# Patient Record
Sex: Male | Born: 1940 | Race: White | Hispanic: No | Marital: Married | Smoking: Former smoker
Health system: Southern US, Community
[De-identification: ages and names within clinical notes are randomized; demographics above are authoritative.]

## PROBLEM LIST (undated history)

## (undated) DIAGNOSIS — E119 Type 2 diabetes mellitus without complications: Secondary | ICD-10-CM

## (undated) DIAGNOSIS — F329 Major depressive disorder, single episode, unspecified: Secondary | ICD-10-CM

## (undated) DIAGNOSIS — T8859XA Other complications of anesthesia, initial encounter: Secondary | ICD-10-CM

## (undated) DIAGNOSIS — I10 Essential (primary) hypertension: Secondary | ICD-10-CM

## (undated) DIAGNOSIS — E7800 Pure hypercholesterolemia, unspecified: Secondary | ICD-10-CM

## (undated) DIAGNOSIS — C801 Malignant (primary) neoplasm, unspecified: Secondary | ICD-10-CM

## (undated) DIAGNOSIS — J45909 Unspecified asthma, uncomplicated: Secondary | ICD-10-CM

## (undated) DIAGNOSIS — J449 Chronic obstructive pulmonary disease, unspecified: Secondary | ICD-10-CM

## (undated) DIAGNOSIS — J189 Pneumonia, unspecified organism: Secondary | ICD-10-CM

## (undated) DIAGNOSIS — R319 Hematuria, unspecified: Secondary | ICD-10-CM

## (undated) DIAGNOSIS — T4145XA Adverse effect of unspecified anesthetic, initial encounter: Secondary | ICD-10-CM

## (undated) DIAGNOSIS — F32A Depression, unspecified: Secondary | ICD-10-CM

## (undated) HISTORY — PX: HERNIA REPAIR: SHX51

## (undated) HISTORY — PX: COLON SURGERY: SHX602

## (undated) HISTORY — PX: EYE SURGERY: SHX253

---

## 2007-08-27 ENCOUNTER — Inpatient Hospital Stay: Payer: Self-pay | Admitting: Internal Medicine

## 2007-08-27 ENCOUNTER — Other Ambulatory Visit: Payer: Self-pay

## 2007-09-10 ENCOUNTER — Ambulatory Visit: Payer: Self-pay | Admitting: Specialist

## 2007-09-12 ENCOUNTER — Ambulatory Visit: Payer: Self-pay | Admitting: Specialist

## 2007-09-18 ENCOUNTER — Ambulatory Visit: Payer: Self-pay | Admitting: Specialist

## 2007-09-22 ENCOUNTER — Ambulatory Visit: Payer: Self-pay | Admitting: Internal Medicine

## 2007-10-09 ENCOUNTER — Emergency Department: Payer: Self-pay | Admitting: Emergency Medicine

## 2007-10-20 ENCOUNTER — Ambulatory Visit: Payer: Self-pay | Admitting: Specialist

## 2009-09-19 IMAGING — CR DG CHEST 1V PORT
1 series · 1 of 1 positions shown · non-contrast
Comparison: none

REASON FOR EXAM: dyspnea, hypoxemia
COMMENTS:

PROCEDURE:     DXR - DXR PORTABLE CHEST SINGLE VIEW  - August 27, 2007  [DATE]
RESULT:     Cardiomegaly is noted with mild pulmonary vascular prominence.
No focal infiltrates are noted.

[view not recorded]
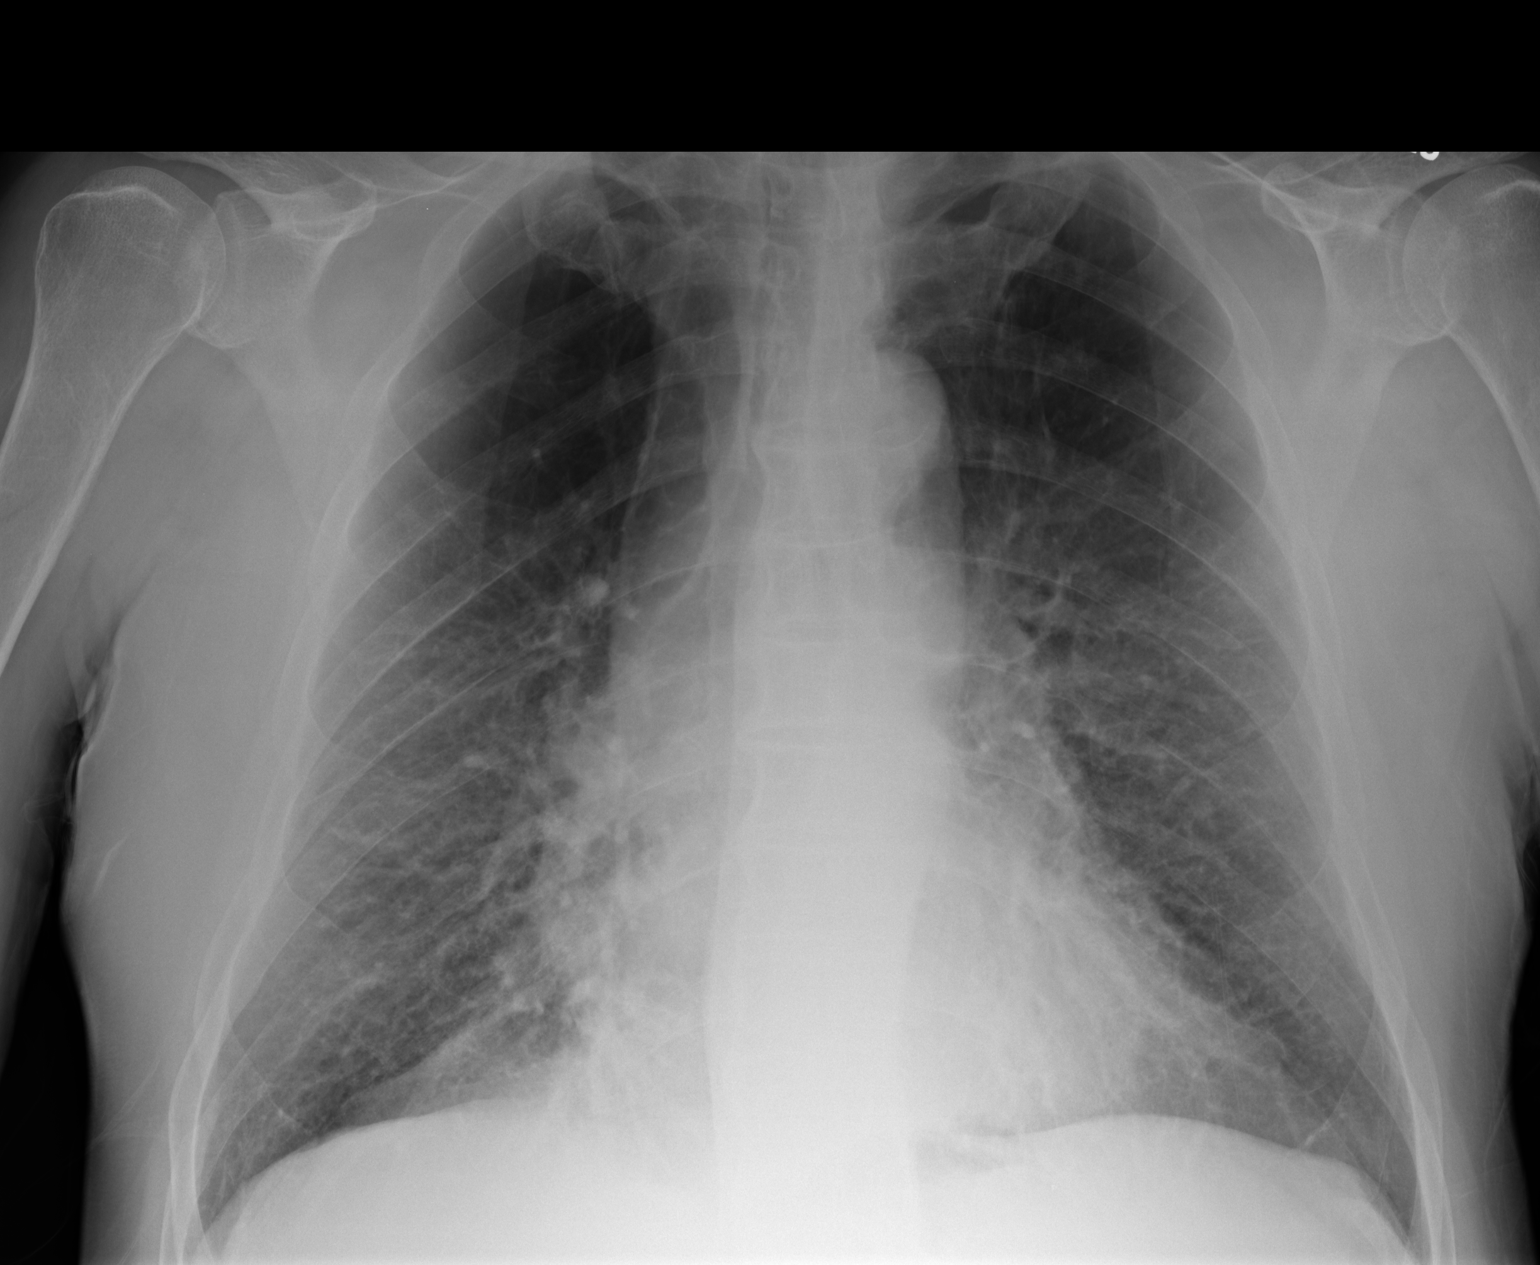

[1 of 1 positions shown; findings below may reference images not displayed]

IMPRESSION: Cardiomegaly with mild pulmonary vascular prominence.

## 2009-09-21 IMAGING — CR DG CHEST 2V
1 series · 2 of 2 positions shown · non-contrast
Comparison: none

REASON FOR EXAM: hypoxia
COMMENTS:

[Series 1: view not recorded · 0.17mm/px · 2 of 2 slices shown]
[im 1/2]
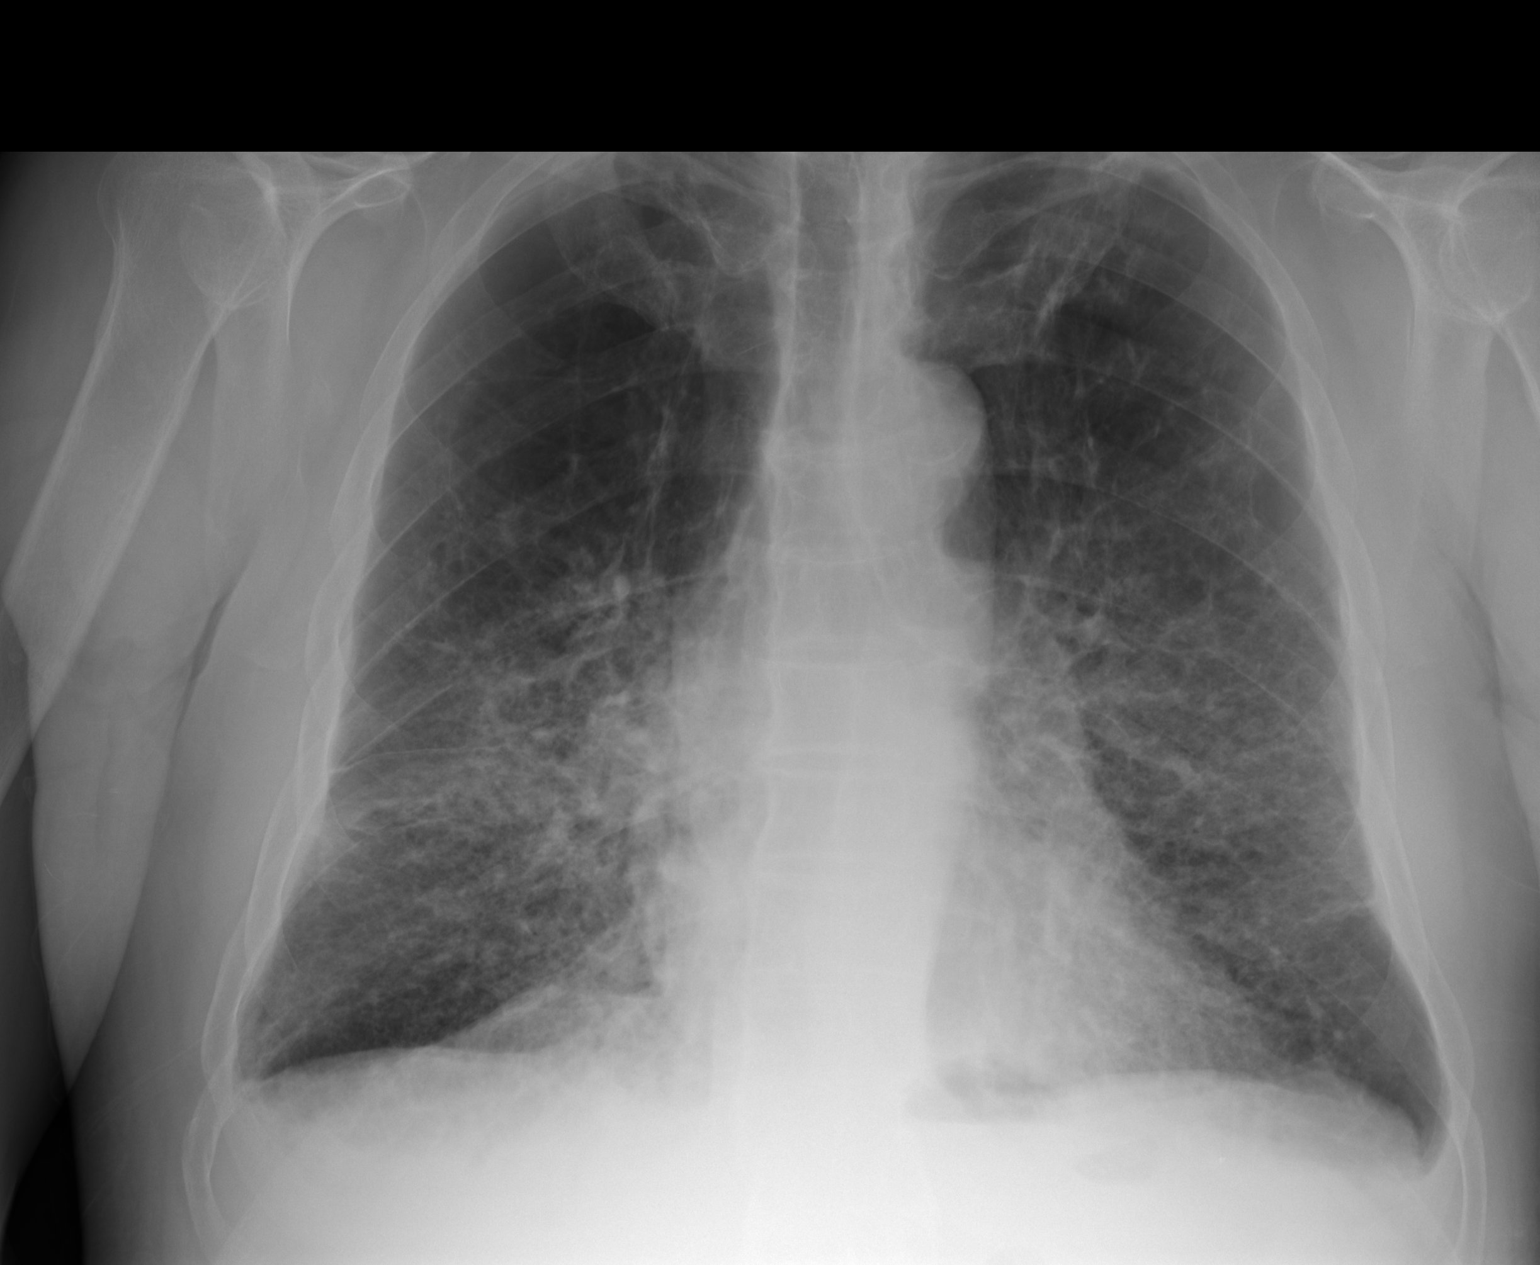
[im 2/2]
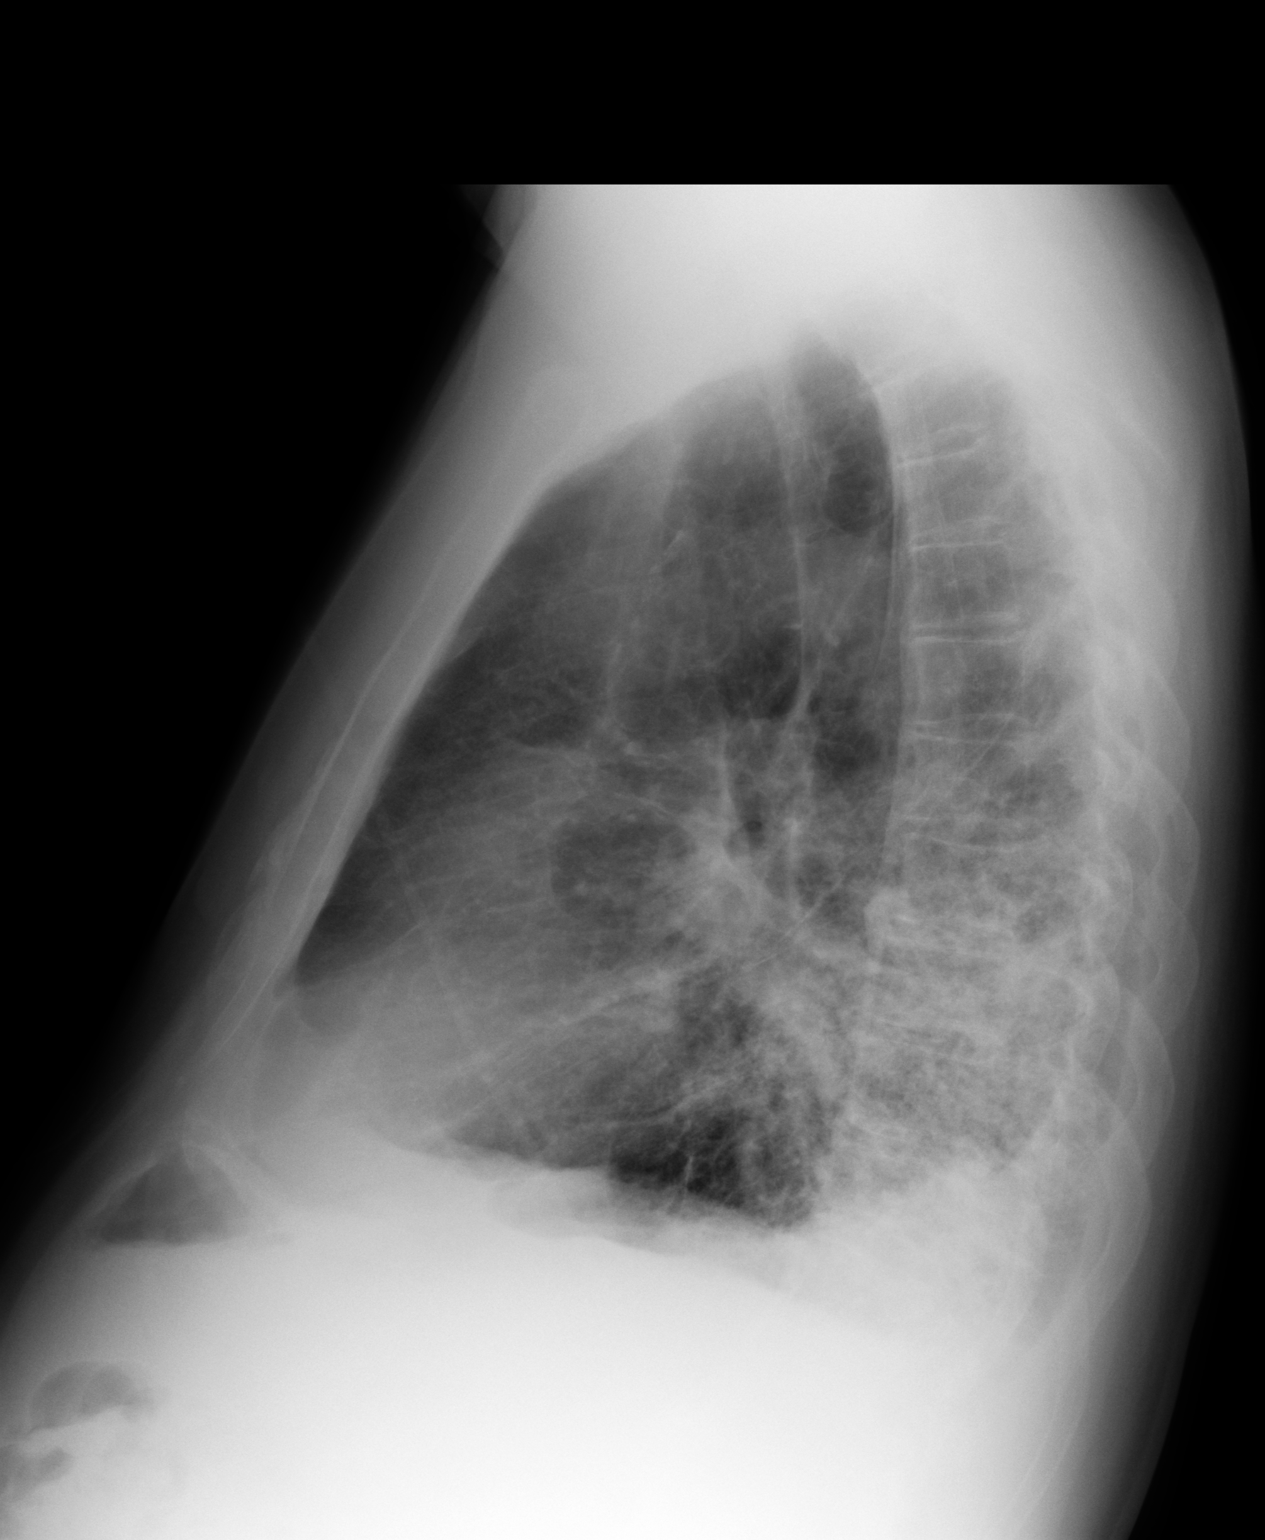

[2 of 2 positions shown; findings below may reference images not displayed]

PROCEDURE:     DXR - DXR CHEST PA (OR AP) AND LATERAL  - August 29, 2007 [DATE]

RESULT:     Comparison is made to a prior exam of 08/27/2007. There is
diffuse increase in density in the RIGHT lower lobe. There is blunting of
the RIGHT posterior gutter compatible with a small RIGHT effusion. The
general appearance suggests interstitial edema superimposed on COPD.
Pneumonia cannot be totally excluded from the differential but at this point
is considered less likely. Continued followup is recommended. Bullous
changes and fibrotic changes are noted in both upper lobes. Overall heart
size is normal.
IMPRESSION: There is increased interstitial and alveolar density in the
RIGHT lower lobe with there being a small associated RIGHT pleural effusion.
Atypical pulmonary edema in a patient with COPD would be the first
consideration; however, pneumonia cannot be excluded from the differential
at this point and continued followup is recommended.

## 2009-09-23 IMAGING — CR DG CHEST 2V
1 series · 2 of 2 positions shown · non-contrast
Comparison: none

REASON FOR EXAM: pna vs chf
COMMENTS:

[Series 1: view not recorded · 0.17mm/px · 2 of 2 slices shown]
[im 1/2]
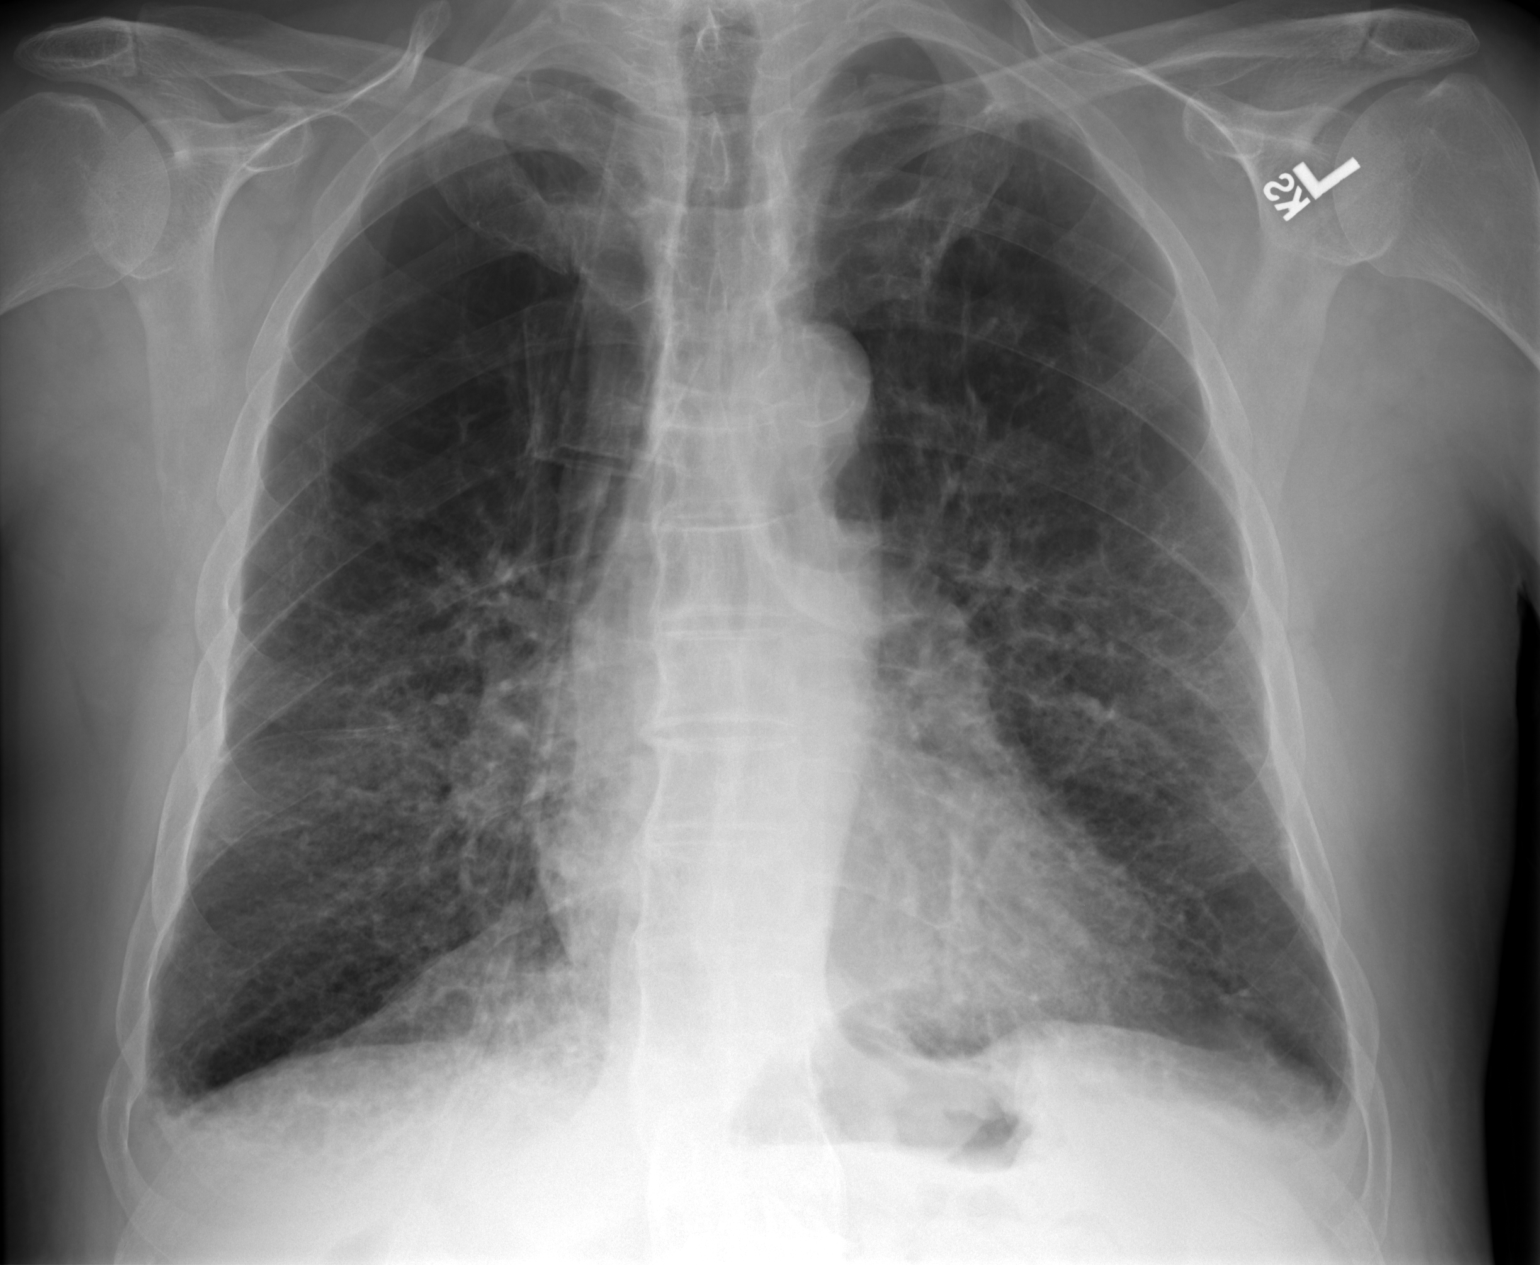
[im 2/2]
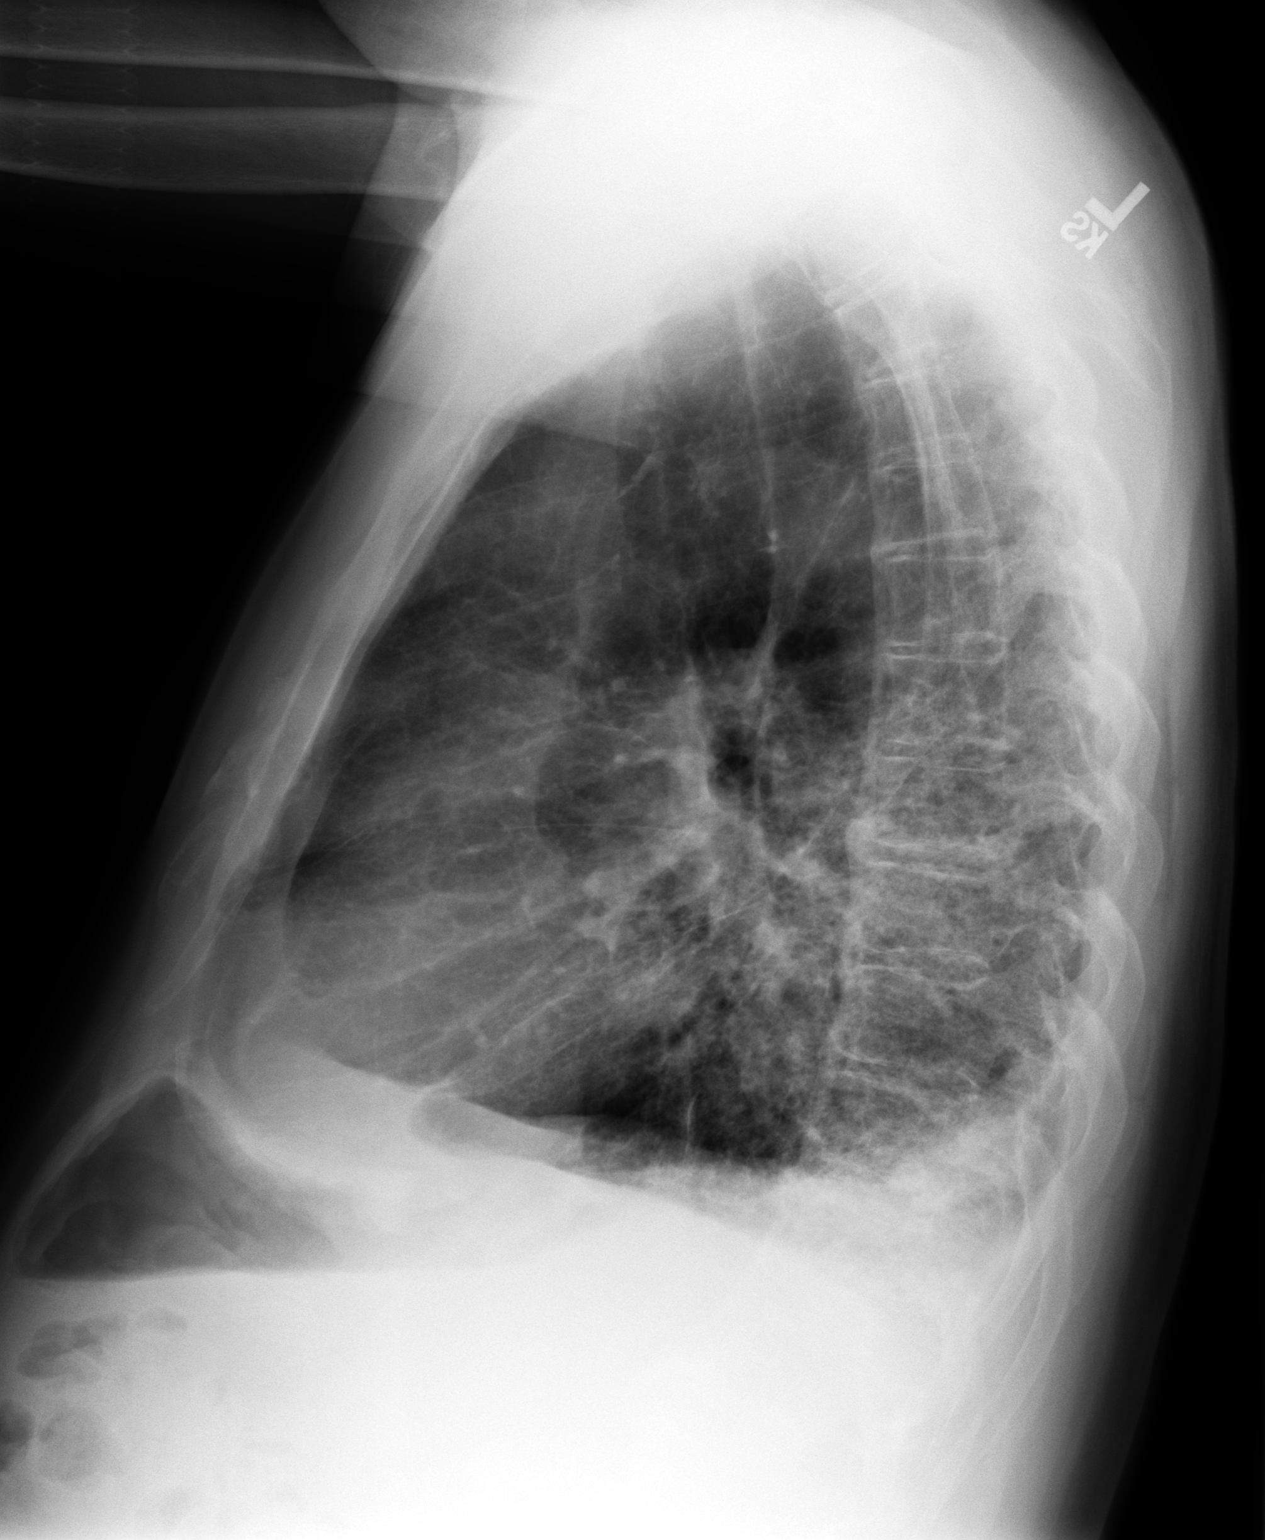

[2 of 2 positions shown; findings below may reference images not displayed]

PROCEDURE:     DXR - DXR CHEST PA (OR AP) AND LATERAL  - August 31, 2007  [DATE]

RESULT:     Comparison is made to study 29 August, 2007.

The interstitial markings remain increased but have improved slightly. The
lungs remain hyperinflated. Minimal blunting of the costophrenic angles
remains visible on the frontal film. On the lateral film there is a small
amount of blunting of the posterior costophrenic gutters.
IMPRESSION: 1.There has been slight interval improvement in the appearance of the
interstitial densities in both lungs. This may reflect resolution of
pulmonary edema of cardiac cause or could reflect some improving infectious
process. Continued follow-up films are recommended.

## 2009-09-24 IMAGING — CT CT CHEST W/O CM
2 series · 16 of 31 positions shown, 19 images · non-contrast
Comparison: none

REASON FOR EXAM: ild
COMMENTS:

[Series 2: soft tissue · axial · 0.80mm/px · z∈[-1114,-884]mm · 7 of 75 slices shown]
[im 10/75  mediastinal]
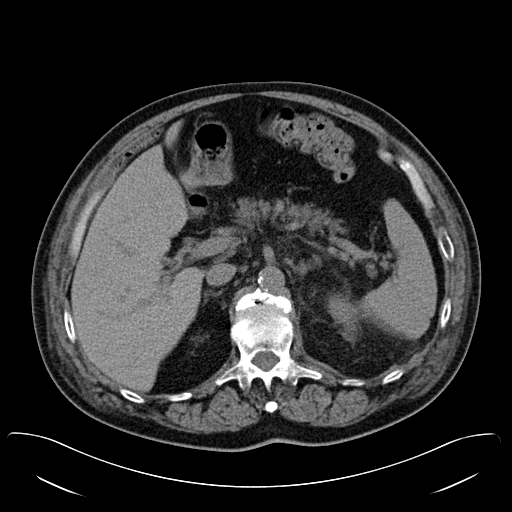
[im 19/75  mediastinal]
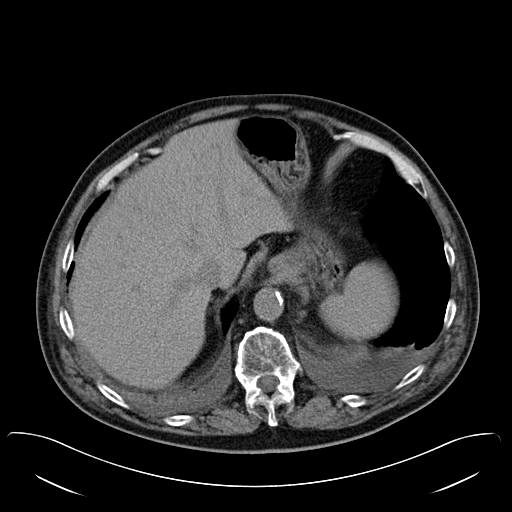
[im 25/75  mediastinal]
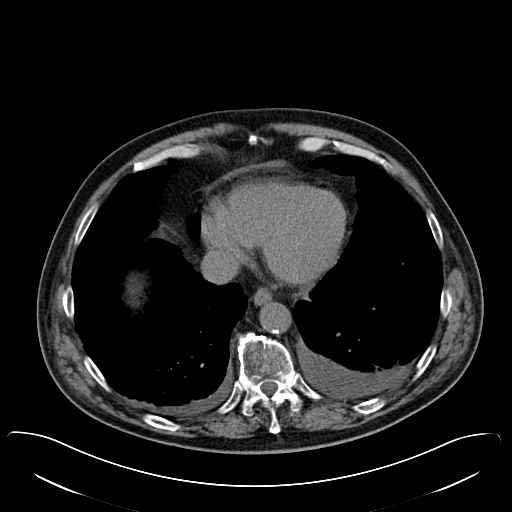
[im 33/75  mediastinal]
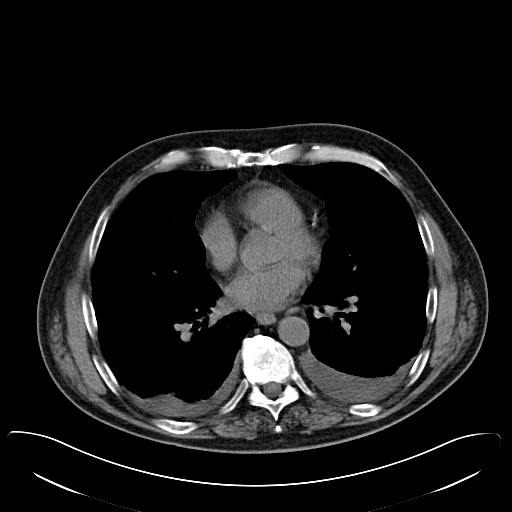
[im 42/75  mediastinal]
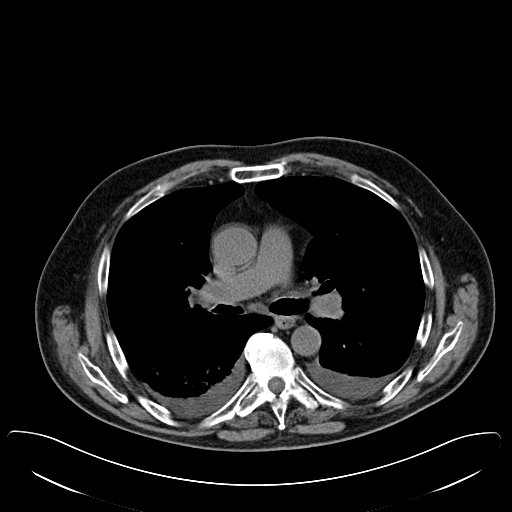
[im 50/75  mediastinal]
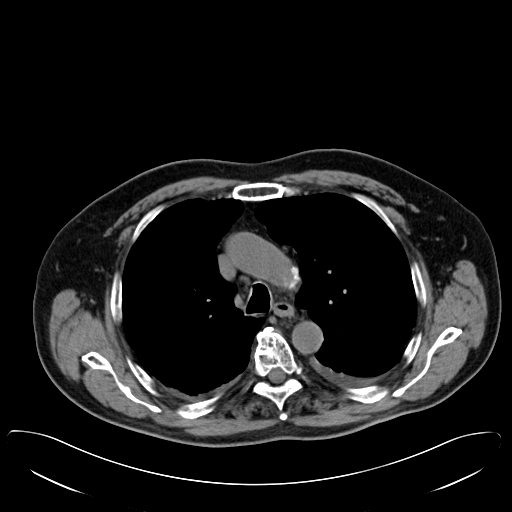
[im 56/75  mediastinal]
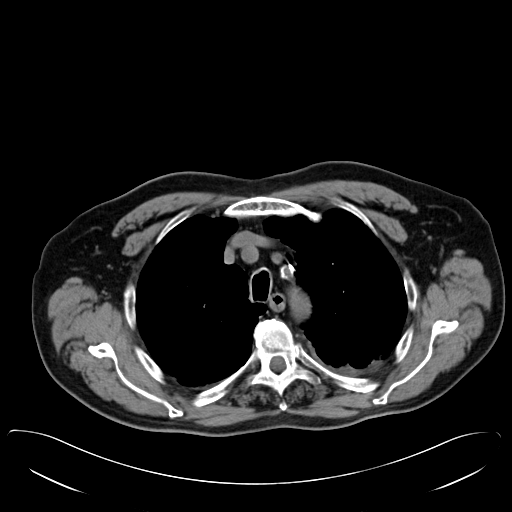

[Series 4: hi res · axial · 0.80mm/px · z∈[-1128,-824]mm · 9 of 48 slices shown, 12 images]
[im 5/48  mediastinal]
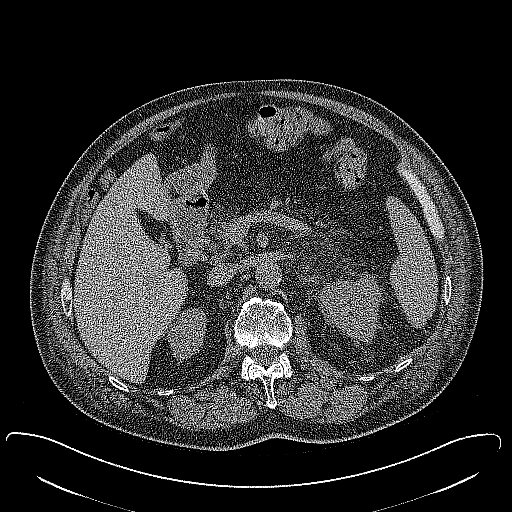
[im 5/48  lung]
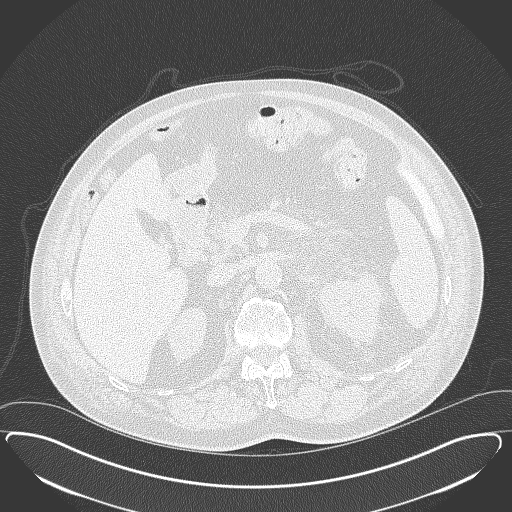
[im 10/48  lung]
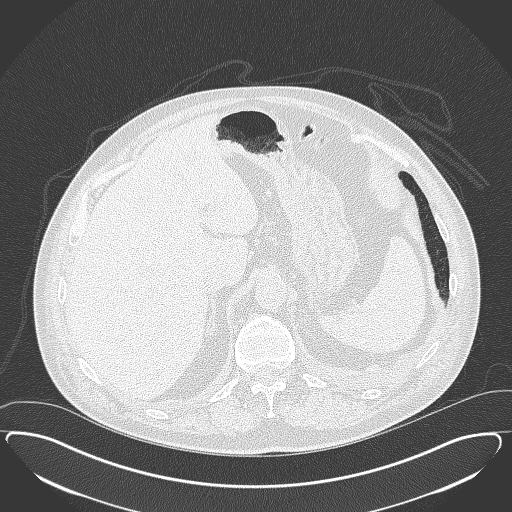
[im 15/48  lung]
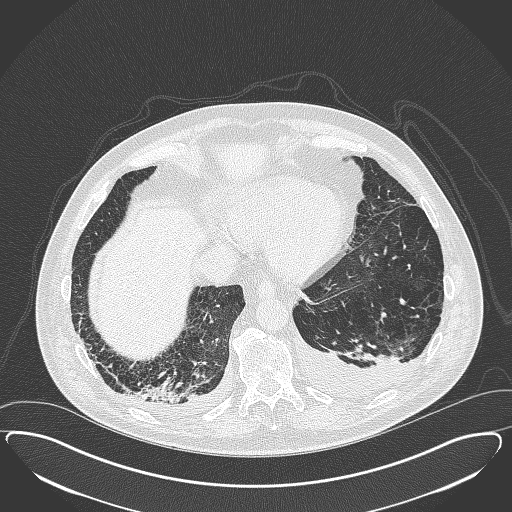
[im 19/48  lung]
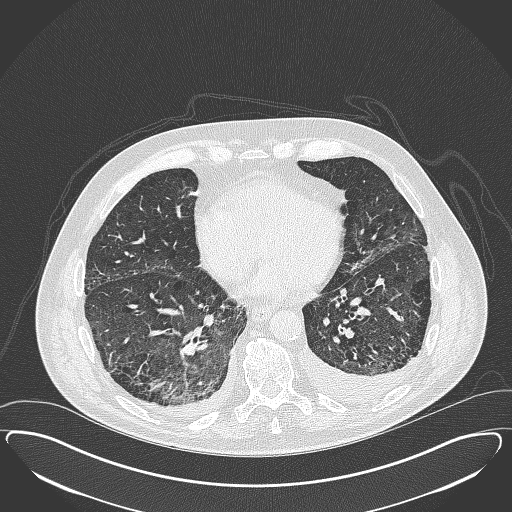
[im 24/48  mediastinal]
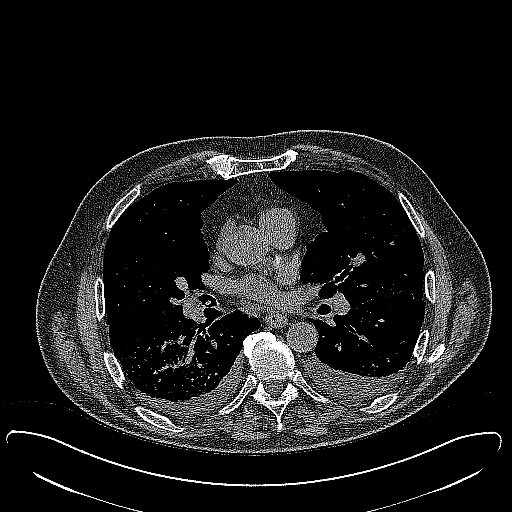
[im 24/48  lung]
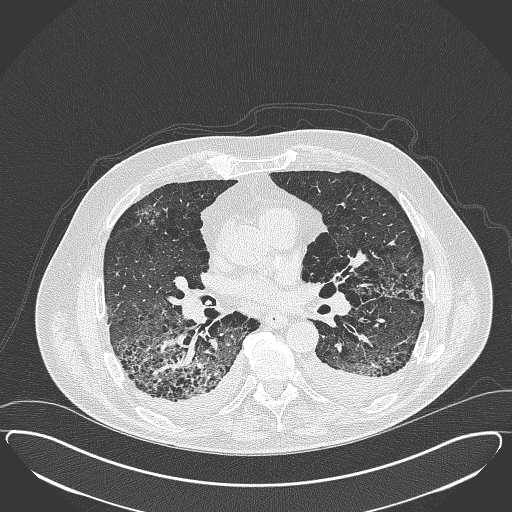
[im 29/48  lung]
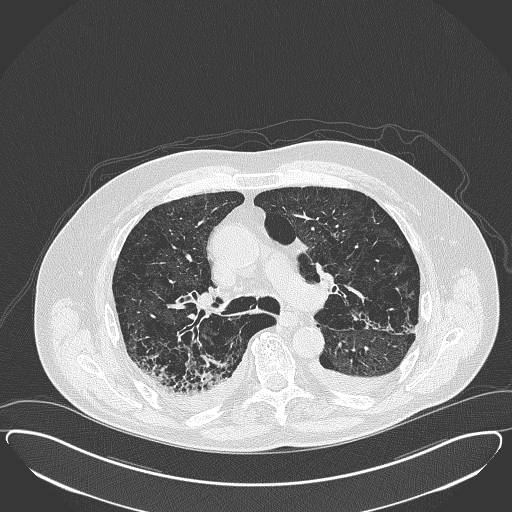
[im 33/48  lung]
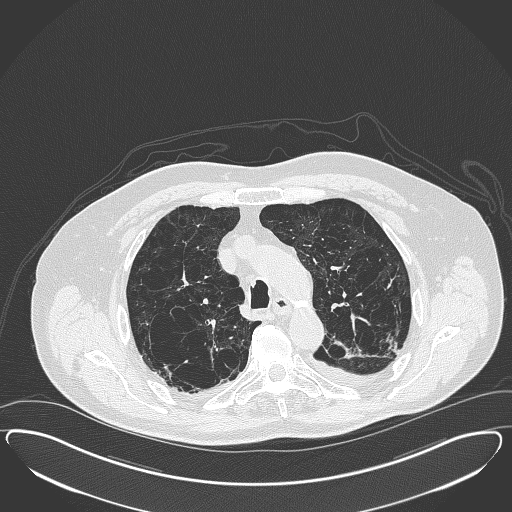
[im 38/48  lung]
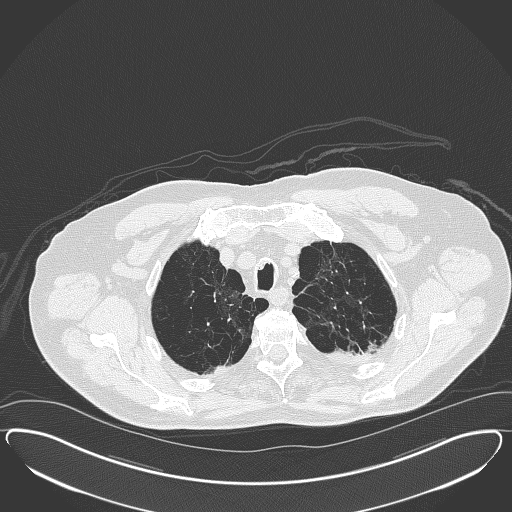
[im 43/48  mediastinal]
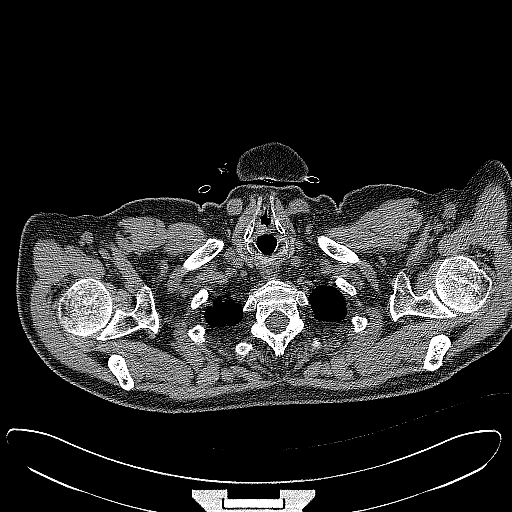
[im 43/48  lung]
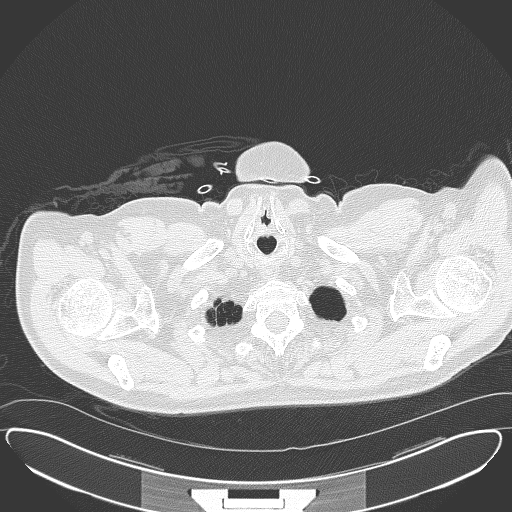

[16 of 31 positions shown; findings below may reference images not displayed]

PROCEDURE:     CT  - CT CHEST WITHOUT CONTRAST  - September 01, 2007  [DATE]

RESULT:     Helical 5-mm sections were obtained from the thoracic inlet
through the lung bases without the administration of intravenous contrast.
The patient has an elevated Creatinine. Also included is high-resolution
imaging of the lung parenchyma.

Evaluation of the mediastinum, hilar regions and structures demonstrates no
evidence of mediastinal or hilar adenopathy or masses.  Small bilateral
effusions are identified.  Evaluation of the lung parenchyma demonstrates
severe emphysematous changes within the lung apices. This is compounded by
diffuse interstitial lung disease with areas of intraparenchymal septal
thickening and subpleural septal thickening and honeycombing within the lung
bases RIGHT greater than LEFT.  No focal regions of consolidation are
appreciated.  Evaluation of the visualized upper abdominal viscera once
again demonstrates hydronephrosis involving the LEFT kidney which was
described on a previous study dated 08/27/07. There appears to be slight
increase in the perinephric stranding as well as the hydronephrosis.  No
further gross abnormalities are identified.
IMPRESSION: 1.     Emphysematous changes and interstitial changes as described above.
No focal regions of consolidation are appreciated.   These findings appear
to be moderate to severe.
2.     Small bilateral pleural effusions.
3.     Persistent hydronephrosis LEFT kidney.

## 2009-10-09 ENCOUNTER — Emergency Department: Payer: Self-pay | Admitting: Emergency Medicine

## 2009-10-10 ENCOUNTER — Emergency Department: Payer: Self-pay | Admitting: Emergency Medicine

## 2009-10-24 ENCOUNTER — Ambulatory Visit: Payer: Self-pay | Admitting: Urology

## 2012-03-24 ENCOUNTER — Ambulatory Visit: Payer: Self-pay | Admitting: Internal Medicine

## 2012-04-09 ENCOUNTER — Other Ambulatory Visit: Payer: Self-pay | Admitting: Urology

## 2012-04-09 NOTE — Progress Notes (Signed)
Surgery scheduled for 04/21/12.  Preop appointment 04/15/12 at 1000.  Need orders in EPIC.  Thanks.

## 2012-04-10 ENCOUNTER — Encounter (HOSPITAL_COMMUNITY): Payer: Self-pay | Admitting: Pharmacy Technician

## 2012-04-14 NOTE — Patient Instructions (Addendum)
20 Alejandro Schmidt  04/14/2012   Your procedure is scheduled on:  04/21/12  MONDAY  Report to [facility] at   0700    AM.  Call this number if you have problems the morning of surgery: [phone]     BRING INHALERS WITH YOU TO HOSPITAL  Remember: DO NOT TAKE ANY BLOOD SUGAR MEDICATION MORNING OF SURGERY  Do not eat food  Or drink :After Midnight. Sunday NIGHT   Take these medicines the morning of surgery with A SIP OF WATER:  Amlodipine, Zoloft     Symbicort                                              May use Combivent if needed   .  Contacts, dentures or partial plates can not be worn to surgery  Leave suitcase in the car. After surgery it may be brought to your room.  For patients admitted to the hospital, checkout time is 11:00 AM day of  discharge.             SPECIAL INSTRUCTIONS- SEE [facility] PREPARING FOR SURGERY INSTRUCTION SHEET-     DO NOT WEAR JEWELRY, LOTIONS, POWDERS, OR PERFUMES.  WOMEN-- DO NOT SHAVE LEGS OR UNDERARMS FOR 12 HOURS BEFORE SHOWERS. MEN MAY SHAVE FACE.  Patients discharged the day of surgery will not be allowed to drive home. IF going home the day of surgery, you must have a driver and someone to stay with you for the first 24 hours  Name and phone number of your driver:  Fonnie Birkenhead                                                                      Please read over the following fact sheets that you were given: MRSA Information, Incentive Spirometry Sheet, Blood Transfusion Sheet  Information                                                                                   [name]  PST [phone]  [phone]                 FAILURE TO FOLLOW THESE INSTRUCTIONS MAY RESULT IN  CANCELLATION   OF YOUR SURGERY                                                  Patient Signature _____________________________

## 2012-04-15 ENCOUNTER — Encounter (HOSPITAL_COMMUNITY): Payer: Self-pay

## 2012-04-15 ENCOUNTER — Ambulatory Visit (HOSPITAL_COMMUNITY)
Admission: RE | Admit: 2012-04-15 | Discharge: 2012-04-15 | Disposition: A | Discharge status: Home / Self Care | Payer: Medicare Other | Source: Ambulatory Visit | Attending: Urology | Admitting: Urology

## 2012-04-15 ENCOUNTER — Encounter (HOSPITAL_COMMUNITY)
Admission: RE | Admit: 2012-04-15 | Discharge: 2012-04-15 | Disposition: A | Discharge status: Home / Self Care | Payer: Medicare Other | Source: Ambulatory Visit | Attending: Urology | Admitting: Urology

## 2012-04-15 DIAGNOSIS — J449 Chronic obstructive pulmonary disease, unspecified: Secondary | ICD-10-CM | POA: Insufficient documentation

## 2012-04-15 DIAGNOSIS — N478 Other disorders of prepuce: Secondary | ICD-10-CM | POA: Insufficient documentation

## 2012-04-15 DIAGNOSIS — Z01812 Encounter for preprocedural laboratory examination: Secondary | ICD-10-CM | POA: Insufficient documentation

## 2012-04-15 DIAGNOSIS — N210 Calculus in bladder: Secondary | ICD-10-CM | POA: Insufficient documentation

## 2012-04-15 DIAGNOSIS — J4489 Other specified chronic obstructive pulmonary disease: Secondary | ICD-10-CM | POA: Insufficient documentation

## 2012-04-15 DIAGNOSIS — I10 Essential (primary) hypertension: Secondary | ICD-10-CM | POA: Insufficient documentation

## 2012-04-15 DIAGNOSIS — N471 Phimosis: Secondary | ICD-10-CM | POA: Insufficient documentation

## 2012-04-15 HISTORY — DX: Malignant (primary) neoplasm, unspecified: C80.1

## 2012-04-15 HISTORY — DX: Pure hypercholesterolemia, unspecified: E78.00

## 2012-04-15 HISTORY — DX: Essential (primary) hypertension: I10

## 2012-04-15 HISTORY — DX: Depression, unspecified: F32.A

## 2012-04-15 HISTORY — DX: Hematuria, unspecified: R31.9

## 2012-04-15 HISTORY — DX: Unspecified asthma, uncomplicated: J45.909

## 2012-04-15 HISTORY — DX: Chronic obstructive pulmonary disease, unspecified: J44.9

## 2012-04-15 HISTORY — DX: Major depressive disorder, single episode, unspecified: F32.9

## 2012-04-15 HISTORY — DX: Type 2 diabetes mellitus without complications: E11.9

## 2012-04-15 HISTORY — DX: Pneumonia, unspecified organism: J18.9

## 2012-04-15 LAB — BASIC METABOLIC PANEL
BUN: 14 mg/dL (ref 6–23)
CO2: 27 mEq/L (ref 19–32)
Calcium: 9.6 mg/dL (ref 8.4–10.5)
Chloride: 107 mEq/L (ref 96–112)
Creatinine, Ser: 0.88 mg/dL (ref 0.50–1.35)
GFR calc Af Amer: >90 mL/min (ref >90)
GFR calc non Af Amer: 84 mL/min — ABNORMAL LOW (ref >90)
Glucose, Bld: 157 mg/dL — ABNORMAL HIGH (ref 70–99)
Potassium: 5.2 mEq/L — ABNORMAL HIGH (ref 3.5–5.1)
Sodium: 141 mEq/L (ref 135–145)

## 2012-04-15 LAB — CBC
HCT: 41.7 % (ref 39.0–52.0)
Hemoglobin: 13.6 g/dL (ref 13.0–17.0)
MCH: 28.1 pg (ref 26.0–34.0)
MCHC: 32.6 g/dL (ref 30.0–36.0)
MCV: 86.2 fL (ref 78.0–100.0)
Platelets: 243 10*3/uL (ref 150–400)
RBC: 4.84 MIL/uL (ref 4.22–5.81)
RDW: 13.9 % (ref 11.5–15.5)
WBC: 10.2 10*3/uL (ref 4.0–10.5)

## 2012-04-15 LAB — SURGICAL PCR SCREEN
MRSA, PCR: NEGATIVE
Staphylococcus aureus: NEGATIVE

## 2012-04-15 NOTE — Progress Notes (Signed)
04/15/12 1005  OBSTRUCTIVE SLEEP APNEA  Have you ever been diagnosed with sleep apnea through a sleep study? No  Do you snore loudly (loud enough to be heard through closed doors)?  0  Do you often feel tired, fatigued, or sleepy during the daytime? 1  Has anyone observed you stop breathing during your sleep? 0  Do you have, or are you being treated for high blood pressure? 1  BMI more than 35 kg/m2? 0  Age over 73 years old? 1  Neck circumference greater than 40 cm/18 inches? 0  Gender: 1  Obstructive Sleep Apnea Score 4  Score 4 or greater  Results sent to PCP

## 2012-04-15 NOTE — Progress Notes (Signed)
EKG 3/14 on chart

## 2012-04-19 DIAGNOSIS — N210 Calculus in bladder: Secondary | ICD-10-CM | POA: Diagnosis present

## 2012-04-19 DIAGNOSIS — N471 Phimosis: Secondary | ICD-10-CM | POA: Diagnosis present

## 2012-04-19 NOTE — H&P (Signed)
History of Present Illness        Bladder calculus: A renal ultrasound done on 03/24/12 revealed no evidence of any renal calculi however a ~2 cm bladder stone was noted.  Left parapelvic cyst: This is noted on renal ultrasound in 3/14. No other abnormality of the kidneys was noted.   He reports to me that for about 2 or 3 weeks she's had fairly significant urgency with associated frequency and some mild dysuria. He's had nocturia as well as intermittency and slowing of his urinary stream. He really didn't have much in the way of voiding symptoms prior to this occurring. He has experienced gross hematuria. He has not any fever or chills. His symptoms are of moderate severity with with no modifying factors. He also reports that he has been unable to retract his foreskin. When he does attempt this causes bleeding. He has not had difficulty with infections beneath the foreskin.   Past Medical History Problems  1. History of  Anxiety (Symptom) 300.00 2. History of  Asthma 493.90 3. History of  Colon Cancer V10.05 4. History of  Depression 311 5. History of  Diabetes Mellitus 250.00 6. History of  Hypercholesterolemia 272.0 7. History of  Hypertension 401.9 8. History of  Nephrolithiasis V13.01  Surgical History Problems  1. History of  Colon Surgery 2. History of  Hernia Repair Bilateral  Current Meds 1. AmLODIPine Besylate 10 MG Oral Tablet; Therapy: (Recorded:08Apr2014) to 2. Losartan Potassium 50 MG Oral Tablet; Therapy: (Recorded:08Apr2014) to 3. MetFORMIN HCl 1000 MG Oral Tablet; Therapy: (Recorded:08Apr2014) to 4. Zoloft 50 MG Oral Tablet; Therapy: (Recorded:08Apr2014) to  Allergies Medication  1. No Known Drug Allergies  Family History Problems  1. Family history of  No Significant Family History  Social History Problems    Caffeine Use   Former Smoker V15.82   Marital History - Currently Married   Retired From Work Denied    History of  Alcohol Use  Review of  Systems Genitourinary, constitutional, skin, eye, otolaryngeal, hematologic/lymphatic, cardiovascular, pulmonary, endocrine, musculoskeletal, gastrointestinal, neurological and psychiatric system(s) were reviewed and pertinent findings if present are noted.  Genitourinary: urinary frequency, feelings of urinary urgency, dysuria, nocturia, incontinence, weak urinary stream, urinary stream starts and stops, hematuria and penile pain.  ENT: sinus problems.  Hematologic/Lymphatic: a tendency to easily bruise.  Respiratory: shortness of breath and cough.  Psychiatric: anxiety and depression.    Vitals Vital Signs BMI Calculated: 24.2 BSA Calculated: 1.74 Height: 5 ft 5.5 in Weight: 147 lb  Blood Pressure: 137 / 74 Temperature: 99.3 F Heart Rate: 85  Physical Exam Constitutional: Well nourished and well developed . No acute distress.  ENT:. The ears and nose are normal in appearance.  Neck: The appearance of the neck is normal and no neck mass is present.  Pulmonary: No respiratory distress and normal respiratory rhythm and effort.  Cardiovascular: Heart rate and rhythm are normal . No peripheral edema.  Abdomen: The abdomen is soft and nontender. No masses are palpated. No CVA tenderness. No hernias are palpable. No hepatosplenomegaly noted.  Rectal: Rectal exam demonstrates normal sphincter tone, no tenderness and no masses. He has a mild rectal stricture. The prostate has no nodularity and is not tender. The left seminal vesicle is nonpalpable. The right seminal vesicle is nonpalpable. The perineum is normal on inspection.  Genitourinary: Examination of the penis demonstrates phimosis, but no discharge, no masses, no lesions and a normal meatus. The penis is uncircumcised. The scrotum is without lesions. The  right epididymis is palpably normal and non-tender. The left epididymis is palpably normal and non-tender. The right testis is non-tender and without masses. The left testis is non-tender  and without masses.  Lymphatics: The femoral and inguinal nodes are not enlarged or tender.  Skin: Normal skin turgor, no visible rash and no visible skin lesions.  Neuro/Psych:. Mood and affect are appropriate.    Results/Data Urine  COLOR YELLOW   APPEARANCE CLEAR   SPECIFIC GRAVITY 1.020   pH 5.5   GLUCOSE NEG mg/dL  BILIRUBIN NEG   KETONE NEG mg/dL  BLOOD MOD   PROTEIN 30 mg/dL  UROBILINOGEN 0.2 mg/dL  NITRITE NEG   LEUKOCYTE ESTERASE SMALL   SQUAMOUS EPITHELIAL/HPF RARE   WBC 0-2 WBC/hpf  RBC 3-6 RBC/hpf  BACTERIA RARE   CRYSTALS NONE SEEN   CASTS NONE SEEN    Old records or history reviewed: Notes from Dr. Tilman Neat office as above.  The following images/tracing/specimen were independently visualized:  KUB: I see what appeared to be at least 2 if not possibly 3 large stones within his bladder. They appear fairly radio opaque so I hope is that he will not be particularly dense.  The following clinical lab reports were reviewed:  UA: His urine had no sign of infection.  The following radiology reports were reviewed: Ultrasound as above.    Assessment Assessed  1. Bladder Calculus 594.1 2. Peripelvic Cyst In The Left Kidney 593.2 3. Phimosis 605 4. Benign Prostatic Hypertrophy With Urinary Obstruction 600.01   We discussed the need to address his bladder calculi. I went over the procedure cystolitholapaxy with him including how the procedure is performed. The risks and complications and alternatives. I don't think this needs to be performed open. We did discuss the fact that bladder calculi typically form in patient's that do not empty the bladder completely and I told him that I would evaluate his outlet at the time of the procedure and if it appeared to be obstructing I would consider proceeding with a transurethral incision of the prostate in order to reduce his risk of recurrent bladder calculi formation. He was in agreement with that. He asked if I could also perform a  circumcision. He has a significant phimosis. I told him that that could be performed simultaneously. I went over this procedure as well. We discussed its risks and complications, the probability of success of both of the procedures as well as the anticipated postoperative course. He understands and has elected to proceed with both.   Plan    He will be scheduled for outpatient cystolitholapaxy, possible transurethral incision of his prostate and circumcision.

## 2012-04-21 ENCOUNTER — Encounter (HOSPITAL_COMMUNITY): Payer: Self-pay | Admitting: Certified Registered Nurse Anesthetist

## 2012-04-21 ENCOUNTER — Ambulatory Visit (HOSPITAL_COMMUNITY): Payer: Medicare Other | Admitting: Certified Registered Nurse Anesthetist

## 2012-04-21 ENCOUNTER — Encounter (HOSPITAL_COMMUNITY)
Admission: RE | Disposition: A | Discharge status: Home / Self Care | Payer: Self-pay | Source: Ambulatory Visit | Attending: Urology

## 2012-04-21 ENCOUNTER — Ambulatory Visit (HOSPITAL_COMMUNITY)
Admission: RE | Admit: 2012-04-21 | Discharge: 2012-04-21 | Disposition: A | Discharge status: Home / Self Care | Payer: Medicare Other | Source: Ambulatory Visit | Attending: Urology | Admitting: Urology

## 2012-04-21 ENCOUNTER — Encounter (HOSPITAL_COMMUNITY): Payer: Self-pay | Admitting: *Deleted

## 2012-04-21 DIAGNOSIS — F329 Major depressive disorder, single episode, unspecified: Secondary | ICD-10-CM | POA: Insufficient documentation

## 2012-04-21 DIAGNOSIS — Z85038 Personal history of other malignant neoplasm of large intestine: Secondary | ICD-10-CM | POA: Insufficient documentation

## 2012-04-21 DIAGNOSIS — Q619 Cystic kidney disease, unspecified: Secondary | ICD-10-CM | POA: Insufficient documentation

## 2012-04-21 DIAGNOSIS — F3289 Other specified depressive episodes: Secondary | ICD-10-CM | POA: Insufficient documentation

## 2012-04-21 DIAGNOSIS — N471 Phimosis: Secondary | ICD-10-CM | POA: Diagnosis present

## 2012-04-21 DIAGNOSIS — N401 Enlarged prostate with lower urinary tract symptoms: Secondary | ICD-10-CM | POA: Insufficient documentation

## 2012-04-21 DIAGNOSIS — E7800 Pure hypercholesterolemia, unspecified: Secondary | ICD-10-CM | POA: Insufficient documentation

## 2012-04-21 DIAGNOSIS — R32 Unspecified urinary incontinence: Secondary | ICD-10-CM | POA: Insufficient documentation

## 2012-04-21 DIAGNOSIS — N210 Calculus in bladder: Secondary | ICD-10-CM

## 2012-04-21 DIAGNOSIS — R350 Frequency of micturition: Secondary | ICD-10-CM | POA: Insufficient documentation

## 2012-04-21 DIAGNOSIS — J4489 Other specified chronic obstructive pulmonary disease: Secondary | ICD-10-CM | POA: Insufficient documentation

## 2012-04-21 DIAGNOSIS — E119 Type 2 diabetes mellitus without complications: Secondary | ICD-10-CM | POA: Insufficient documentation

## 2012-04-21 DIAGNOSIS — N478 Other disorders of prepuce: Secondary | ICD-10-CM | POA: Insufficient documentation

## 2012-04-21 DIAGNOSIS — I10 Essential (primary) hypertension: Secondary | ICD-10-CM | POA: Insufficient documentation

## 2012-04-21 DIAGNOSIS — Z87891 Personal history of nicotine dependence: Secondary | ICD-10-CM | POA: Insufficient documentation

## 2012-04-21 DIAGNOSIS — R351 Nocturia: Secondary | ICD-10-CM | POA: Insufficient documentation

## 2012-04-21 DIAGNOSIS — R3915 Urgency of urination: Secondary | ICD-10-CM | POA: Insufficient documentation

## 2012-04-21 DIAGNOSIS — F411 Generalized anxiety disorder: Secondary | ICD-10-CM | POA: Insufficient documentation

## 2012-04-21 DIAGNOSIS — R39198 Other difficulties with micturition: Secondary | ICD-10-CM | POA: Insufficient documentation

## 2012-04-21 DIAGNOSIS — Z79899 Other long term (current) drug therapy: Secondary | ICD-10-CM | POA: Insufficient documentation

## 2012-04-21 DIAGNOSIS — N138 Other obstructive and reflux uropathy: Secondary | ICD-10-CM | POA: Insufficient documentation

## 2012-04-21 DIAGNOSIS — J449 Chronic obstructive pulmonary disease, unspecified: Secondary | ICD-10-CM | POA: Insufficient documentation

## 2012-04-21 DIAGNOSIS — Z87442 Personal history of urinary calculi: Secondary | ICD-10-CM | POA: Insufficient documentation

## 2012-04-21 HISTORY — DX: Adverse effect of unspecified anesthetic, initial encounter: T41.45XA

## 2012-04-21 HISTORY — PX: HOLMIUM LASER APPLICATION: SHX5852

## 2012-04-21 HISTORY — PX: TRANSURETHRAL INCISION OF PROSTATE: SHX2573

## 2012-04-21 HISTORY — PX: CYSTOSCOPY WITH LITHOLAPAXY: SHX1425

## 2012-04-21 HISTORY — DX: Other complications of anesthesia, initial encounter: T88.59XA

## 2012-04-21 HISTORY — PX: CIRCUMCISION: SHX1350

## 2012-04-21 LAB — GLUCOSE, CAPILLARY: Glucose-Capillary: 176 mg/dL — ABNORMAL HIGH (ref 70–99)

## 2012-04-21 SURGERY — CYSTOSCOPY, WITH BLADDER CALCULUS LITHOLAPAXY
Anesthesia: General | Wound class: Clean Contaminated

## 2012-04-21 MED ORDER — ALBUTEROL SULFATE HFA 108 (90 BASE) MCG/ACT IN AERS
INHALATION_SPRAY | RESPIRATORY_TRACT | Status: AC
  Filled 2012-04-21: qty 6.7

## 2012-04-21 MED ORDER — LACTATED RINGERS IV SOLN
INTRAVENOUS | Status: DC | PRN
  Administered 2012-04-21 (×2): via INTRAVENOUS

## 2012-04-21 MED ORDER — PHENAZOPYRIDINE HCL 200 MG PO TABS
200.0000 mg | ORAL_TABLET | Freq: Once | ORAL | Status: AC
  Administered 2012-04-21: 200 mg via ORAL
  Filled 2012-04-21: qty 1

## 2012-04-21 MED ORDER — LIDOCAINE HCL 2 % EX GEL
CUTANEOUS | Status: DC | PRN
  Administered 2012-04-21: 1 via URETHRAL

## 2012-04-21 MED ORDER — BACITRACIN ZINC 500 UNIT/GM EX OINT
TOPICAL_OINTMENT | CUTANEOUS | Status: AC
  Filled 2012-04-21: qty 15

## 2012-04-21 MED ORDER — ALBUTEROL SULFATE HFA 108 (90 BASE) MCG/ACT IN AERS
INHALATION_SPRAY | RESPIRATORY_TRACT | Status: DC | PRN
  Administered 2012-04-21: 5 via RESPIRATORY_TRACT

## 2012-04-21 MED ORDER — ONDANSETRON HCL 4 MG/2ML IJ SOLN
INTRAMUSCULAR | Status: DC | PRN
  Administered 2012-04-21: 4 mg via INTRAVENOUS

## 2012-04-21 MED ORDER — HYDROCODONE-ACETAMINOPHEN 10-325 MG PO TABS: 1.0000 | ORAL_TABLET | Freq: Four times a day (QID) | ORAL | Status: AC | PRN

## 2012-04-21 MED ORDER — CEFAZOLIN SODIUM-DEXTROSE 2-3 GM-% IV SOLR
2.0000 g | INTRAVENOUS | Status: AC
  Administered 2012-04-21: 2 g via INTRAVENOUS

## 2012-04-21 MED ORDER — FENTANYL CITRATE 0.05 MG/ML IJ SOLN
INTRAMUSCULAR | Status: DC | PRN
  Administered 2012-04-21: 50 ug via INTRAVENOUS
  Administered 2012-04-21: 25 ug via INTRAVENOUS
  Administered 2012-04-21: 50 ug via INTRAVENOUS
  Administered 2012-04-21 (×2): 25 ug via INTRAVENOUS
  Administered 2012-04-21 (×2): 50 ug via INTRAVENOUS
  Administered 2012-04-21: 25 ug via INTRAVENOUS

## 2012-04-21 MED ORDER — BUPIVACAINE HCL 0.5 % IJ SOLN
INTRAMUSCULAR | Status: AC
  Filled 2012-04-21: qty 1

## 2012-04-21 MED ORDER — OXYCODONE HCL 5 MG/5ML PO SOLN
5.0000 mg | Freq: Once | ORAL | Status: DC | PRN
  Filled 2012-04-21: qty 5

## 2012-04-21 MED ORDER — ACETAMINOPHEN 10 MG/ML IV SOLN
INTRAVENOUS | Status: AC
  Filled 2012-04-21: qty 100

## 2012-04-21 MED ORDER — ACETAMINOPHEN 10 MG/ML IV SOLN
INTRAVENOUS | Status: DC | PRN
  Administered 2012-04-21: 1000 mg via INTRAVENOUS

## 2012-04-21 MED ORDER — OXYCODONE HCL 5 MG PO TABS: 5.0000 mg | ORAL_TABLET | Freq: Once | ORAL | Status: DC | PRN

## 2012-04-21 MED ORDER — LIDOCAINE HCL 2 % EX GEL
CUTANEOUS | Status: AC
  Filled 2012-04-21: qty 10

## 2012-04-21 MED ORDER — PROMETHAZINE HCL 25 MG/ML IJ SOLN: 6.2500 mg | INTRAMUSCULAR | Status: DC | PRN

## 2012-04-21 MED ORDER — PROPOFOL 10 MG/ML IV EMUL
INTRAVENOUS | Status: DC | PRN
  Administered 2012-04-21: 150 mg via INTRAVENOUS

## 2012-04-21 MED ORDER — ACETAMINOPHEN 10 MG/ML IV SOLN: 1000.0000 mg | Freq: Once | INTRAVENOUS | Status: DC | PRN

## 2012-04-21 MED ORDER — MEPERIDINE HCL 50 MG/ML IJ SOLN: 6.2500 mg | INTRAMUSCULAR | Status: DC | PRN

## 2012-04-21 MED ORDER — SODIUM CHLORIDE 0.9 % IR SOLN
Status: DC | PRN
  Administered 2012-04-21: 15000 mL via INTRAVESICAL

## 2012-04-21 MED ORDER — LABETALOL HCL 5 MG/ML IV SOLN
INTRAVENOUS | Status: DC | PRN
  Administered 2012-04-21: 5 mg via INTRAVENOUS

## 2012-04-21 MED ORDER — LIDOCAINE HCL (CARDIAC) 20 MG/ML IV SOLN
INTRAVENOUS | Status: DC | PRN
  Administered 2012-04-21: 100 mg via INTRAVENOUS

## 2012-04-21 MED ORDER — HYDROMORPHONE HCL PF 1 MG/ML IJ SOLN
0.2500 mg | INTRAMUSCULAR | Status: DC | PRN
  Administered 2012-04-21: 0.25 mg via INTRAVENOUS

## 2012-04-21 MED ORDER — BUPIVACAINE HCL 0.5 % IJ SOLN
INTRAMUSCULAR | Status: DC | PRN
  Administered 2012-04-21: 10 mL

## 2012-04-21 MED ORDER — CEFAZOLIN SODIUM-DEXTROSE 2-3 GM-% IV SOLR
INTRAVENOUS | Status: AC
  Filled 2012-04-21: qty 50

## 2012-04-21 MED ORDER — HYDROMORPHONE HCL PF 1 MG/ML IJ SOLN
INTRAMUSCULAR | Status: AC
  Filled 2012-04-21: qty 1

## 2012-04-21 MED ORDER — IOHEXOL 300 MG/ML  SOLN
INTRAMUSCULAR | Status: AC
  Filled 2012-04-21: qty 1

## 2012-04-21 MED ORDER — BACITRACIN ZINC 500 UNIT/GM EX OINT
TOPICAL_OINTMENT | CUTANEOUS | Status: DC | PRN
  Administered 2012-04-21: 1 via TOPICAL

## 2012-04-21 SURGICAL SUPPLY — 41 items
BAG URINE DRAINAGE (UROLOGICAL SUPPLIES) IMPLANT
BAG URO CATCHER STRL LF (DRAPE) ×2 IMPLANT
BANDAGE COBAN STERILE 2 (GAUZE/BANDAGES/DRESSINGS) ×2 IMPLANT
BLADE SURG 15 STRL LF DISP TIS (BLADE) ×1 IMPLANT
BLADE SURG 15 STRL SS (BLADE) ×1
BNDG COHESIVE 3X5 TAN STRL LF (GAUZE/BANDAGES/DRESSINGS) ×2 IMPLANT
CLOTH BEACON ORANGE TIMEOUT ST (SAFETY) ×2 IMPLANT
COVER SURGICAL LIGHT HANDLE (MISCELLANEOUS) ×2 IMPLANT
DECANTER SPIKE VIAL GLASS SM (MISCELLANEOUS) IMPLANT
DRAPE CAMERA CLOSED 9X96 (DRAPES) ×2 IMPLANT
DRAPE LAPAROTOMY T 102X78X121 (DRAPES) IMPLANT
ELECT REM PT RETURN 9FT ADLT (ELECTROSURGICAL) ×2
ELECT RESECT VAPORIZE 12D CBL (ELECTRODE) ×2 IMPLANT
ELECTRODE REM PT RTRN 9FT ADLT (ELECTROSURGICAL) ×1 IMPLANT
EVACUATOR MICROVAS BLADDER (UROLOGICAL SUPPLIES) ×2 IMPLANT
GAUZE SPONGE 4X4 16PLY XRAY LF (GAUZE/BANDAGES/DRESSINGS) ×2 IMPLANT
GLOVE BIO SURGEON STRL SZ8 (GLOVE) ×2 IMPLANT
GLOVE BIOGEL M 8.0 STRL (GLOVE) ×2 IMPLANT
GOWN PREVENTION PLUS XLARGE (GOWN DISPOSABLE) ×4 IMPLANT
GOWN STRL REIN XL XLG (GOWN DISPOSABLE) ×2 IMPLANT
GUIDEWIRE STR DUAL SENSOR (WIRE) IMPLANT
HOLDER FOLEY CATH W/STRAP (MISCELLANEOUS) IMPLANT
JUMPSUIT BLUE BOOT COVER DISP (PROTECTIVE WEAR) IMPLANT
KIT ASPIRATION TUBING (SET/KITS/TRAYS/PACK) ×2 IMPLANT
KIT BASIN OR (CUSTOM PROCEDURE TRAY) ×2 IMPLANT
LASER FIBER DISP 1000U (UROLOGICAL SUPPLIES) ×6 IMPLANT
LOOPS RESECTOSCOPE DISP (ELECTROSURGICAL) IMPLANT
MANIFOLD NEPTUNE II (INSTRUMENTS) ×2 IMPLANT
NEEDLE HYPO 25X1 1.5 SAFETY (NEEDLE) IMPLANT
NS IRRIG 1000ML POUR BTL (IV SOLUTION) IMPLANT
PACK BASIC VI WITH GOWN DISP (CUSTOM PROCEDURE TRAY) IMPLANT
PACK CYSTO (CUSTOM PROCEDURE TRAY) ×2 IMPLANT
PENCIL BUTTON HOLSTER BLD 10FT (ELECTRODE) ×2 IMPLANT
PROBE EHL 9 FR 470CM (MISCELLANEOUS) IMPLANT
SPONGE GAUZE 4X4 12PLY (GAUZE/BANDAGES/DRESSINGS) ×2 IMPLANT
SUT CHROMIC 3 0 SH 27 (SUTURE) ×4 IMPLANT
SUT CHROMIC 4 0 RB 1X27 (SUTURE) ×4 IMPLANT
SYR CONTROL 10ML LL (SYRINGE) IMPLANT
SYRINGE IRR TOOMEY STRL 70CC (SYRINGE) IMPLANT
TUBING CONNECTING 10 (TUBING) ×2 IMPLANT
WATER STERILE IRR 1500ML POUR (IV SOLUTION) IMPLANT

## 2012-04-21 NOTE — Transfer of Care (Signed)
Immediate Anesthesia Transfer of Care Note  Patient: Alejandro Schmidt  Procedure(s) Performed: Procedure(s): CYSTOSCOPY WITH LITHOLAPAXY (N/A) TRANSURETHRAL INCISION OF THE PROSTATE (TUIP) (N/A) CIRCUMCISION ADULT (N/A) HOLMIUM LASER APPLICATION (N/A)  Patient Location: PACU  Anesthesia Type:General  Level of Consciousness: awake, confused and responds to stimulation  Airway & Oxygen Therapy: Patient Spontanous Breathing and Patient connected to face mask oxygen  Post-op Assessment: Report given to PACU RN, Post -op Vital signs reviewed and stable and Patient moving all extremities  Post vital signs: Reviewed and stable  Complications: No apparent anesthesia complications

## 2012-04-21 NOTE — Anesthesia Postprocedure Evaluation (Signed)
Anesthesia Post Note  Patient: Alejandro Schmidt  Procedure(s) Performed: Procedure(s) (LRB): CYSTOSCOPY WITH LITHOLAPAXY (N/A) TRANSURETHRAL INCISION OF THE PROSTATE (TUIP) (N/A) CIRCUMCISION ADULT (N/A) HOLMIUM LASER APPLICATION (N/A)  Anesthesia type: General  Patient location: PACU  Post pain: Pain level controlled  Post assessment: Post-op Vital signs reviewed  Last Vitals: BP 119/77  Pulse 66  Temp(Src) 36.4 C (Oral)  Resp 14  SpO2 100%  Post vital signs: Reviewed  Level of consciousness: sedated  Complications: No apparent anesthesia complications

## 2012-04-21 NOTE — Progress Notes (Signed)
Small dressing fell off tip of penis in pacu according to report from pacu nurse.  Mild bloody drainage, occasional drip.  Pt did not have any underwear to wear home, gave pt some disposable stretchy underwear and gauze to place over tip of penis.

## 2012-04-21 NOTE — Preoperative (Signed)
Beta Blockers   Reason not to administer Beta Blockers:Not Applicable 

## 2012-04-21 NOTE — Op Note (Signed)
PATIENT:  Alejandro Schmidt  PRE-OPERATIVE DIAGNOSIS: 1. Bladder calculi 6 cm 2. BPH with outlet obstruction 3. Phimosis  POST-OPERATIVE DIAGNOSIS: Same  PROCEDURE: 1. Cystolitholapaxy 2. Transurethral incision of the prostate 3. Circumcision  SURGEON:  Garnett Farm  INDICATION: [name] is a 72 year old male who has recently developed significant voiding symptoms. He was found to have multiple large bladder calculi. We discussed the fact of bladder calculi taken to develop in patients with outlet obstruction and therefore at the time of his cystolitholapaxy I told him I would evaluate the prostate for evidence of obstruction and if noted would manage this with a transurethral incision of the prostate. In addition he has phimosis and we discussed circumcision as well. He has elected to proceed with all of these procedures.  ANESTHESIA:  General  EBL:  Minimal  DRAINS: None  LOCAL MEDICATIONS USED:  10 cc of 1/2% Marcaine as a dorsal penile block and 2% lidocaine jelly instilled in the urethra.  SPECIMEN:  Stone given the patient.  Description of procedure: After informed consent the patient was taken to the major or, placed on the table and administered general anesthesia. He was then moved to the dorsal lithotomy position and his genitalia was sterilely prepped and draped. An official timeout was then performed.  Initially the 22 French cystoscope was passed under direct vision with the 12 lens down the urethra which is noted be entirely normal. The sphincter was intact. The prostatic urethra revealed minimal lateral lobe enlargement and was not elongated but had a high bladder neck present and obstruction. Upon entering the bladder I noted 3 large bladder calculi and and 1+ trabeculation. The ureteral orifices were noted to be well away from the bladder neck and of normal configuration and position.  I chose the 1000  holmium laser fiber and used this to completely  fragmented all of the stones. I intermittently used the Microvasive evacuator to remove the stone fragments and was able to completely fragment the stone and remove all of the stone fragments from the bladder.  I then inserted the 26 French resectoscope sheath with Timberlake obturator and removed the obturator. I inserted the resectoscope element with the 12 lens and the gyrus button. This was then used to incise the prostate at the level of the bladder neck back to the level of the verumontanum at the 6:00 position. I then fulgurated this and noted no bleeding at the end of the procedure. I therefore drained the bladder and remove the resectoscope.  I then performed a dorsal penile block in the standard fashion and marked the location of the glans on the foreskin. I retracted the foreskin and made a circumcising incision subcoronaliy. I then replaced the foreskin in its normal anatomic position and incised along the area that I marked. I divided the foreskin in the midline and excised this from the penis. Bleeding points were cauterized and I then reapproximated the frenulum in the midline with interrupted 4-0 chromic suture. A U stitch was then placed at the 6:00 position using 3-0 chromic and a second 3-0 chromic was placed the 12:00 position and the skin edges were reapproximated in a running fashion. I then applied Neosporin, gauze and a Coban dressing loosely around the incision, instilled 2% lidocaine in the urethra and the patient was awakened and taken to recovery room in stable and satisfactory condition. He tolerated it well no intraoperative complications.  PLAN OF CARE: Discharge to home after PACU  PATIENT DISPOSITION:  PACU - hemodynamically stable.

## 2012-04-21 NOTE — Anesthesia Preprocedure Evaluation (Addendum)
Anesthesia Evaluation  Patient identified by MRN, date of birth, ID band Patient awake    Reviewed: Allergy & Precautions, H&P , NPO status , Patient's Chart, lab work & pertinent test results  History of Anesthesia Complications Negative for: history of anesthetic complications  Airway Mallampati: II TM Distance: >3 FB Neck ROM: Full    Dental  (+) Dental Advisory Given, Teeth Intact, Caps and Chipped   Pulmonary asthma , pneumonia -, resolved, COPD COPD inhaler, former smoker,  breath sounds clear to auscultation  Pulmonary exam normal       Cardiovascular hypertension, Pt. on medications Rhythm:Regular Rate:Normal     Neuro/Psych PSYCHIATRIC DISORDERS Depression negative neurological ROS  negative psych ROS   GI/Hepatic negative GI ROS, Neg liver ROS,   Endo/Other  diabetes, Type 2, Oral Hypoglycemic Agents  Renal/GU negative Renal ROS     Musculoskeletal negative musculoskeletal ROS (+)   Abdominal   Peds  Hematology negative hematology ROS (+)   Anesthesia Other Findings   Reproductive/Obstetrics                          Anesthesia Physical Anesthesia Plan  ASA: III  Anesthesia Plan: General   Post-op Pain Management:    Induction: Intravenous  Airway Management Planned: LMA  Additional Equipment:   Intra-op Plan:   Post-operative Plan: Extubation in OR  Informed Consent: I have reviewed the patients History and Physical, chart, labs and discussed the procedure including the risks, benefits and alternatives for the proposed anesthesia with the patient or authorized representative who has indicated his/her understanding and acceptance.   Dental advisory given  Plan Discussed with: CRNA  Anesthesia Plan Comments:         Anesthesia Quick Evaluation

## 2012-04-21 NOTE — Progress Notes (Signed)
No dressing to assess, pt's penis is a little bloody at the tip due to the procedure.  No incision.

## 2012-04-21 NOTE — Interval H&P Note (Signed)
History and Physical Interval Note:  04/21/2012 9:14 AM  Alejandro Schmidt  has presented today for surgery, with the diagnosis of Bladder Calculi and Phimosis  The various methods of treatment have been discussed with the patient and family. After consideration of risks, benefits and other options for treatment, the patient has consented to  Procedure(s): CYSTOSCOPY WITH LITHOLAPAXY (N/A) TRANSURETHRAL INCISION OF THE PROSTATE (TUIP) (N/A) CIRCUMCISION ADULT (N/A) as a surgical intervention .  The patient's history has been reviewed, patient examined, no change in status, stable for surgery.  I have reviewed the patient's chart and labs.  Questions were answered to the patient's satisfaction.     Garnett Farm

## 2012-04-22 ENCOUNTER — Encounter (HOSPITAL_COMMUNITY): Payer: Self-pay | Admitting: Urology

## 2012-07-29 ENCOUNTER — Inpatient Hospital Stay: Payer: Self-pay | Admitting: Internal Medicine

## 2012-07-29 LAB — COMPREHENSIVE METABOLIC PANEL
Alkaline Phosphatase: 117 U/L (ref 50–136)
Anion Gap: 7 (ref 7–16)
Chloride: 100 mmol/L (ref 98–107)
Co2: 26 mmol/L (ref 21–32)
EGFR (African American): 60 — ABNORMAL LOW
SGOT(AST): 13 U/L — ABNORMAL LOW (ref 15–37)
Sodium: 133 mmol/L — ABNORMAL LOW (ref 136–145)

## 2012-07-29 LAB — URINALYSIS, COMPLETE
Bacteria: NONE SEEN
Bilirubin,UR: NEGATIVE
Granular Cast: 6
Leukocyte Esterase: NEGATIVE

## 2012-07-29 LAB — CBC
HGB: 9.7 g/dL — ABNORMAL LOW (ref 13.0–18.0)
MCH: 28.1 pg (ref 26.0–34.0)
MCHC: 32.7 g/dL (ref 32.0–36.0)
MCV: 86 fL (ref 80–100)
RDW: 15 % — ABNORMAL HIGH (ref 11.5–14.5)
WBC: 12.3 10*3/uL — ABNORMAL HIGH (ref 3.8–10.6)

## 2012-07-30 LAB — BASIC METABOLIC PANEL
BUN: 27 mg/dL — ABNORMAL HIGH (ref 7–18)
Calcium, Total: 8.8 mg/dL (ref 8.5–10.1)
Creatinine: 1.11 mg/dL (ref 0.60–1.30)
EGFR (Non-African Amer.): >60
Glucose: 372 mg/dL — ABNORMAL HIGH (ref 65–99)
Sodium: 139 mmol/L (ref 136–145)

## 2012-07-30 LAB — CBC WITH DIFFERENTIAL/PLATELET
Eosinophil #: 0 10*3/uL (ref 0.0–0.7)
Eosinophil %: 0 %
HCT: 29.5 % — ABNORMAL LOW (ref 40.0–52.0)
HGB: 10 g/dL — ABNORMAL LOW (ref 13.0–18.0)
Monocyte %: 1.4 %
Neutrophil #: 6.8 10*3/uL — ABNORMAL HIGH (ref 1.4–6.5)
RDW: 14.8 % — ABNORMAL HIGH (ref 11.5–14.5)
WBC: 7.5 10*3/uL (ref 3.8–10.6)

## 2012-07-30 LAB — URINE CULTURE

## 2012-08-03 LAB — CBC WITH DIFFERENTIAL/PLATELET
Eosinophil: 1 %
HGB: 9.6 g/dL — ABNORMAL LOW (ref 13.0–18.0)
Lymphocytes: 12 %
MCH: 29.3 pg (ref 26.0–34.0)
MCV: 86 fL (ref 80–100)
Metamyelocyte: 4 %
Monocytes: 5 %
RDW: 15.4 % — ABNORMAL HIGH (ref 11.5–14.5)
Segmented Neutrophils: 76 %
WBC: 11.7 10*3/uL — ABNORMAL HIGH (ref 3.8–10.6)

## 2012-08-03 LAB — BASIC METABOLIC PANEL
Anion Gap: 5 — ABNORMAL LOW (ref 7–16)
BUN: 36 mg/dL — ABNORMAL HIGH (ref 7–18)
Calcium, Total: 8.7 mg/dL (ref 8.5–10.1)
Potassium: 3.4 mmol/L — ABNORMAL LOW (ref 3.5–5.1)

## 2012-08-04 LAB — BASIC METABOLIC PANEL
Anion Gap: 2 — ABNORMAL LOW (ref 7–16)
Calcium, Total: 8.3 mg/dL — ABNORMAL LOW (ref 8.5–10.1)
Chloride: 113 mmol/L — ABNORMAL HIGH (ref 98–107)
Creatinine: 1.06 mg/dL (ref 0.60–1.30)
EGFR (African American): >60
EGFR (Non-African Amer.): >60
Glucose: 123 mg/dL — ABNORMAL HIGH (ref 65–99)
Sodium: 145 mmol/L (ref 136–145)

## 2012-08-04 LAB — CBC WITH DIFFERENTIAL/PLATELET
Bands: 1 %
HCT: 27.6 % — ABNORMAL LOW (ref 40.0–52.0)
HGB: 9.2 g/dL — ABNORMAL LOW (ref 13.0–18.0)
Lymphocytes: 19 %
MCV: 87 fL (ref 80–100)
Metamyelocyte: 3 %
Myelocyte: 2 %
Platelet: 419 10*3/uL (ref 150–440)
RBC: 3.17 10*6/uL — ABNORMAL LOW (ref 4.40–5.90)
Segmented Neutrophils: 72 %

## 2012-12-22 ENCOUNTER — Ambulatory Visit: Payer: Self-pay | Admitting: Gastroenterology

## 2012-12-23 LAB — PATHOLOGY REPORT

## 2013-02-06 ENCOUNTER — Ambulatory Visit: Payer: Self-pay | Admitting: Surgery

## 2013-02-24 ENCOUNTER — Ambulatory Visit: Payer: Self-pay | Admitting: Gastroenterology

## 2013-04-02 ENCOUNTER — Ambulatory Visit: Payer: Self-pay | Admitting: Gastroenterology

## 2013-09-14 ENCOUNTER — Other Ambulatory Visit: Payer: Self-pay | Admitting: Internal Medicine

## 2014-01-01 DEATH — deceased

## 2014-04-17 IMAGING — US US RENAL KIDNEY
1 series · 14 of 25 positions shown · non-contrast
Comparison: none

REASON FOR EXAM: Kidney stone
COMMENTS:

[Series 1: us renal kidney · 0.28mm/px · 14 of 65 slices shown]
[im 1/65]
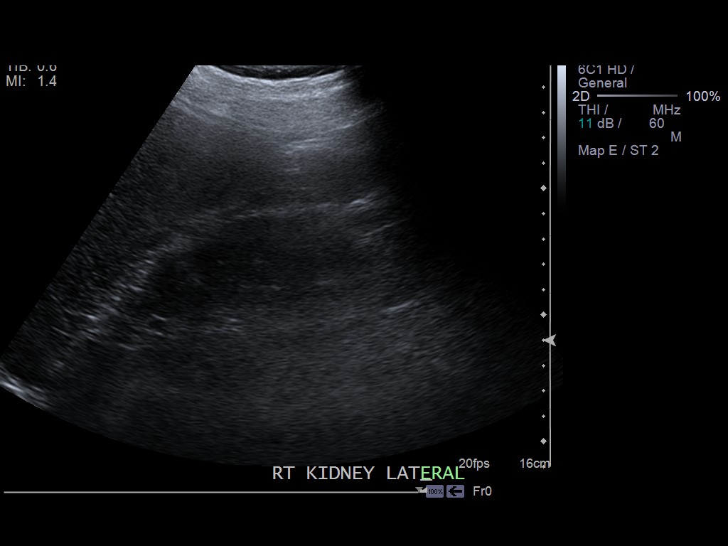
[im 6/65]
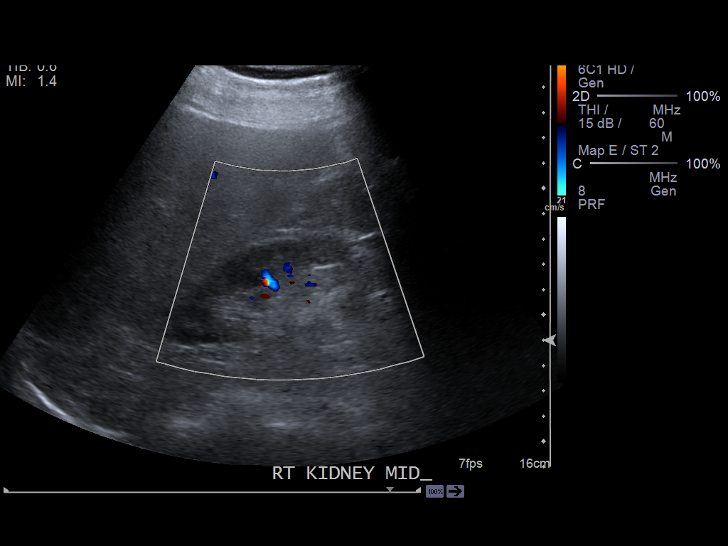
[im 11/65]
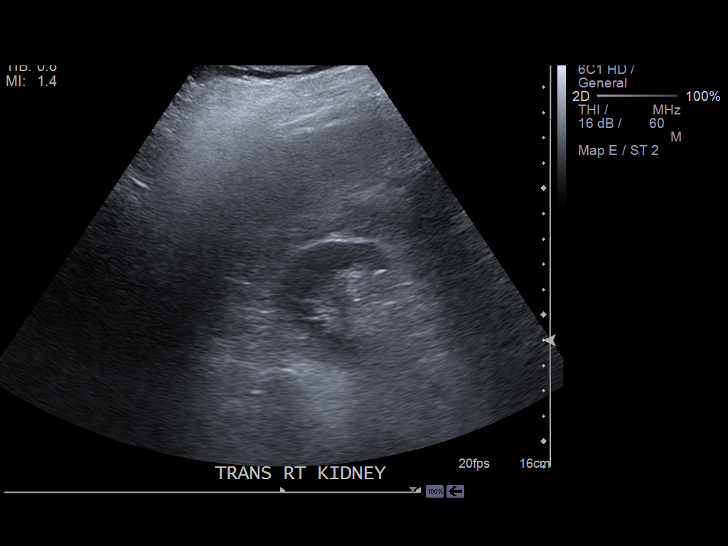
[im 17/65]
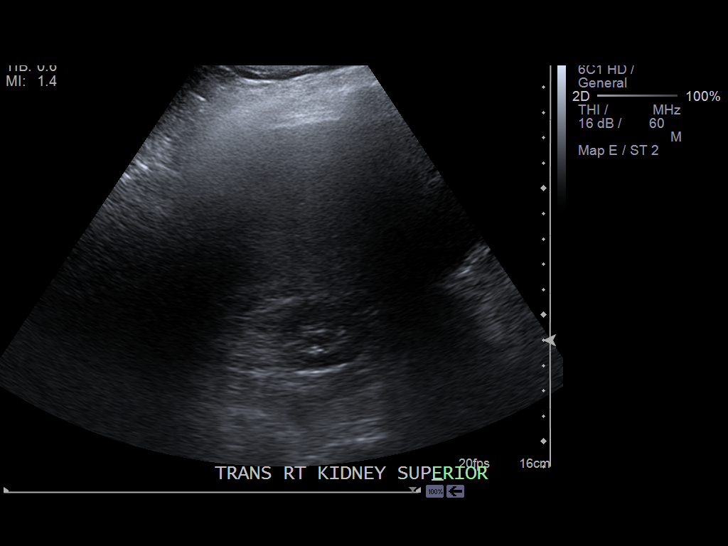
[im 22/65]
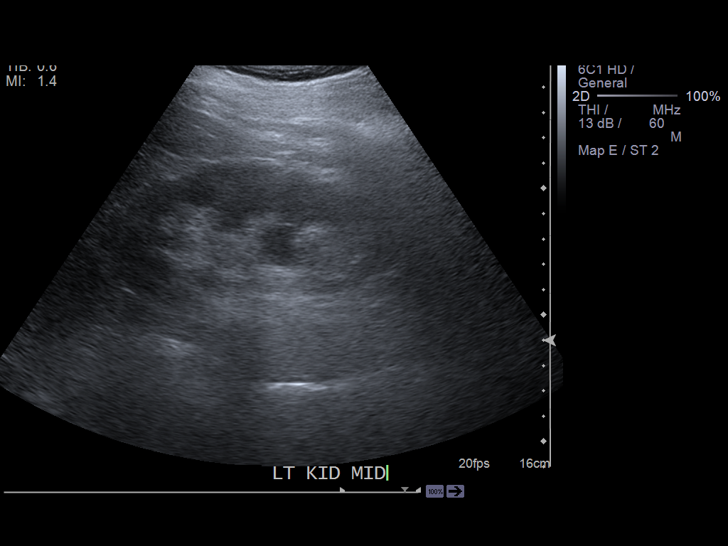
[im 25/65]
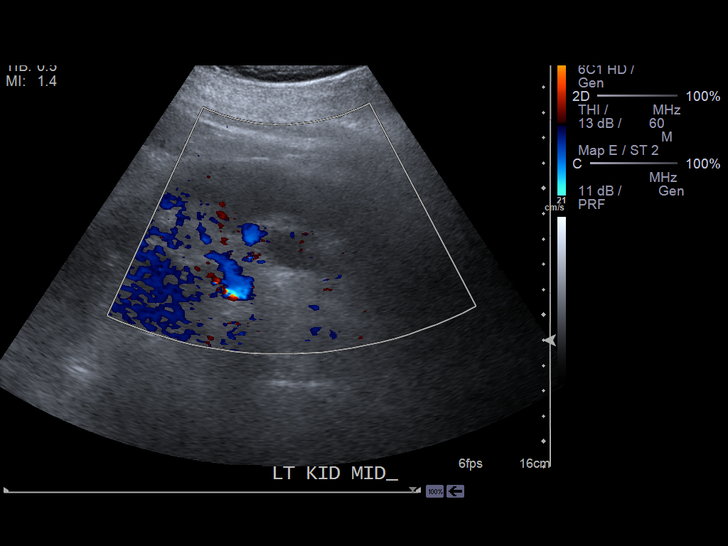
[im 30/65]
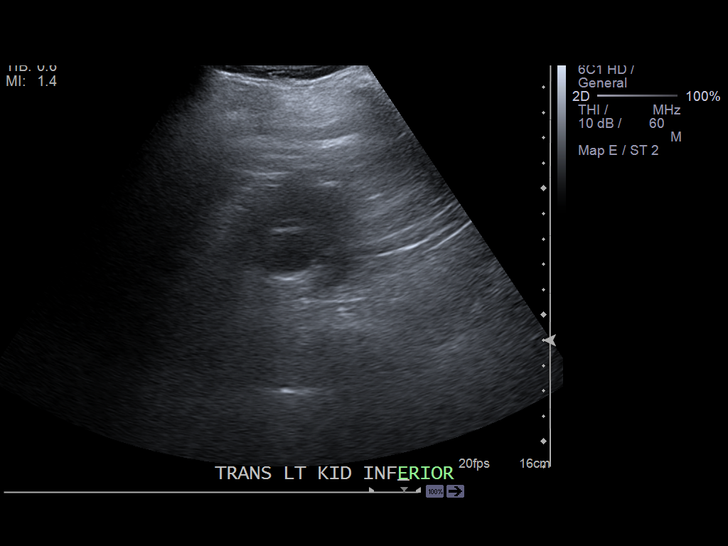
[im 35/65]
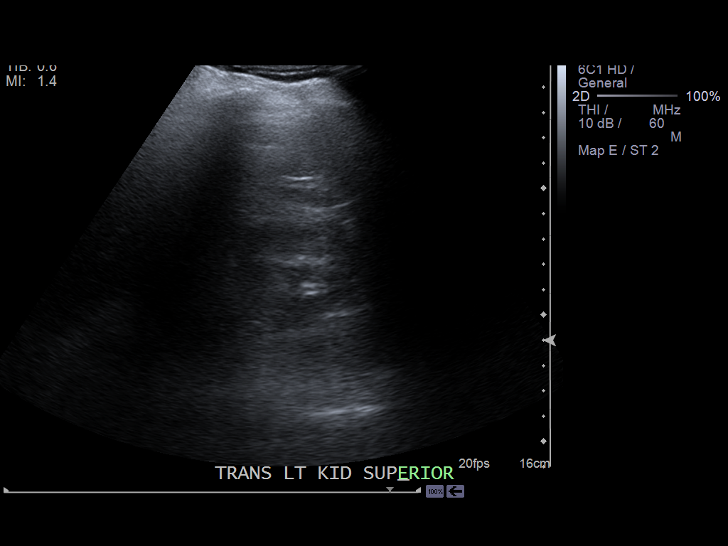
[im 41/65]
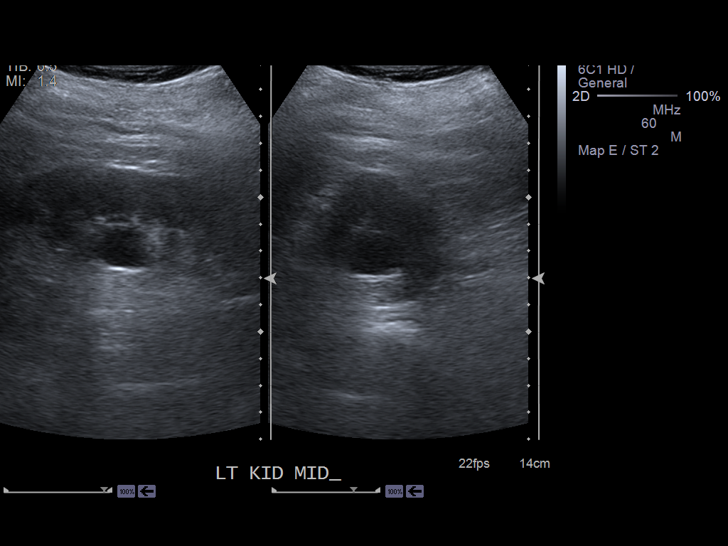
[im 43/65]
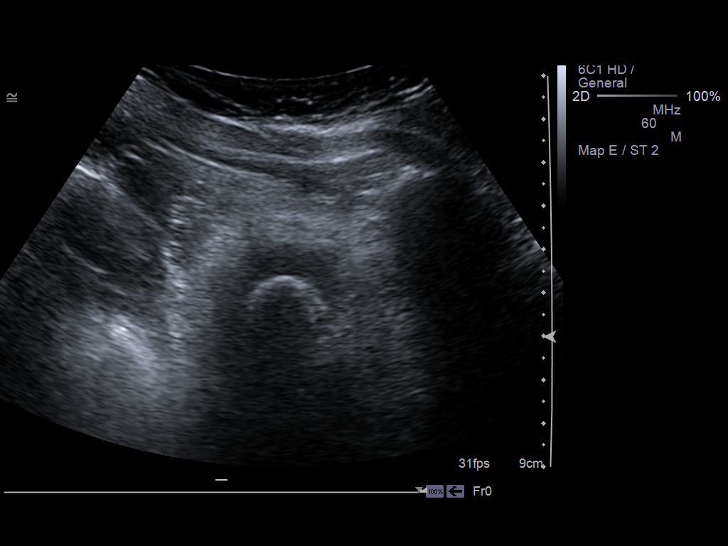
[im 49/65]
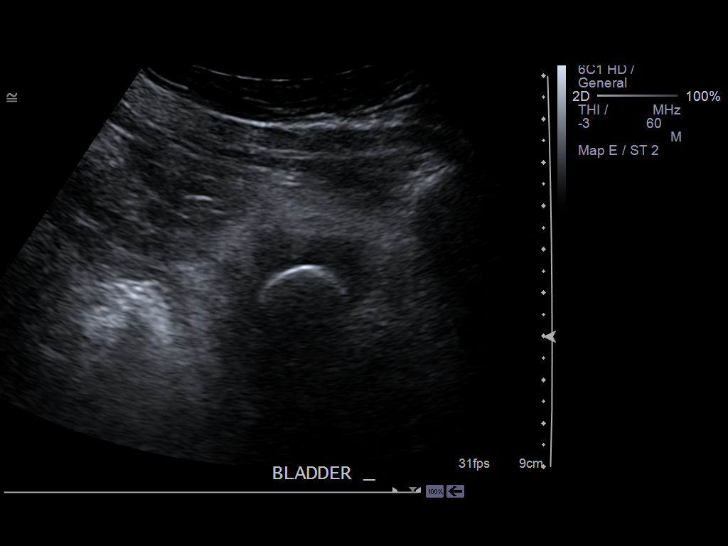
[im 54/65]
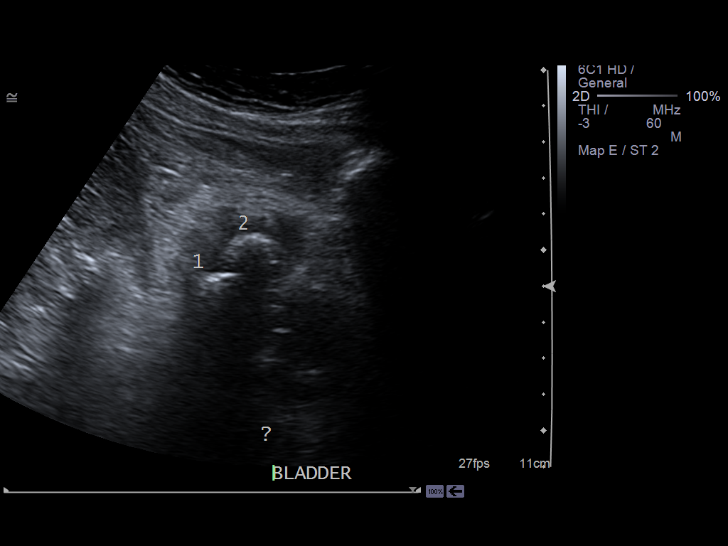
[im 59/65]
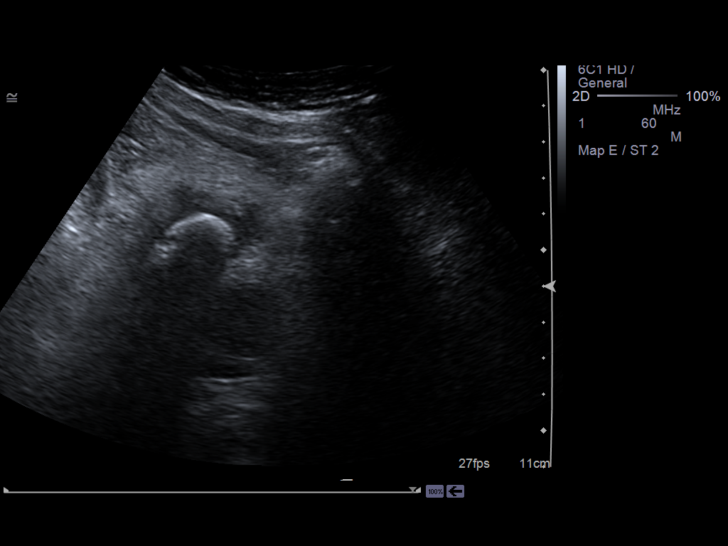
[im 65/65]
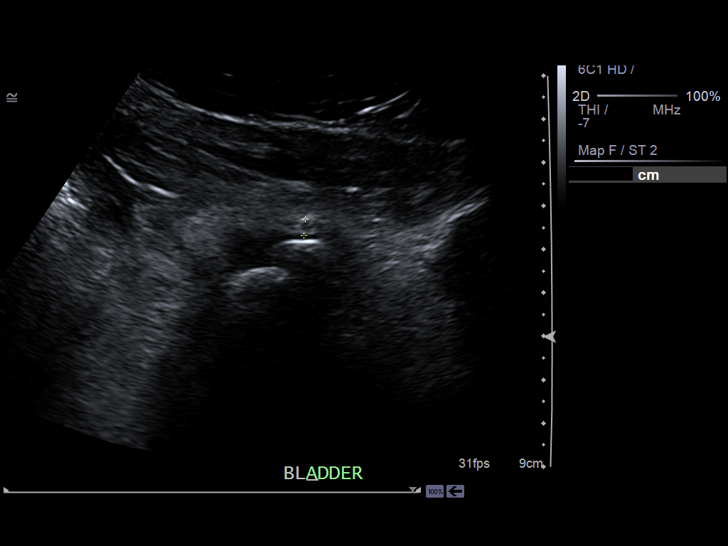

[14 of 25 positions shown; findings below may reference images not displayed]

PROCEDURE:     DLEKE - DLEKE KIDNEYS  - March 24, 2012  [DATE]

RESULT:     Renal sonogram is performed in the standard fashion. The patient
has no previous similar study for comparison.

Right kidney measures 10.19 x 4.62 x 4.41 cm. The left kidney measures
x 4.74 x 4.54 cm. Neither kidney shows evidence of hydronephrosis or
definite nephrolithiasis. There is a anechoic region in the parapelvic area
of the mid left kidney concerning for parapelvic cyst measuring 1.53 x 1.0 x
2.04 cm. Images in the area the bladder show the bladder is nondistended.
There is curvilinear echogenic abnormality with shadowing suggestive of
large bladder stones. In these appear to measure is much as 1.9 cm diameter.
Prostate is measured at 2.32 x 2.42 x 2.9 cm.
IMPRESSION: Parapelvic left renal cyst. Large bladder stones appear
evident.

[REDACTED]

## 2014-04-23 NOTE — H&P (Signed)
PATIENT NAME:  Alejandro Schmidt, Alejandro Schmidt MR#:  [id number] DATE OF BIRTH:  1940/01/23  DATE OF ADMISSION:  07/29/2012  PRIMARY CARE PHYSICIAN:  Dr. Sabra Heck  EMERGENCY ROOM PHYSICIAN:  Delman Kitten, MD  CHIEF COMPLAINT: Elevated blood sugar.  HISTORY OF PRESENT ILLNESS:  The patient is a 74 year old male with history of diabetes who came into the ER because his blood sugar was above 400 this morning. The patient denies any chest pain or trouble breathing. No fever. Does have some dry cough. In the ER, the patient was found to have O2 sats of 88% on room air with bilateral pneumonia so we are going to admit him for community-acquired pneumonia with hypoxia and uncontrolled diabetes. The patient denies any complaints in terms of chest pain or exertional dyspnea. No pedal edema. In the past, the patient was admitted in September of 2009 for COPD flare and multiple times he was on oxygen. The patient follows up with Dr. Devona Konig.  He did not want home oxygen. The patient denies any abdominal pain. No nausea. No vomiting. No diarrhea.   PAST MEDICAL HISTORY:  Significant for chronic renal failure, diabetes,  pulmonary fibrosis and nephrolithiasis.  The patient also has history of hypertension. Also includes colon cancer. Had recurrence. Initial surgery in 1998 and surgery again in 2011.   ALLERGIES: SULFA.   SOCIAL HISTORY: The patient used to smoke 1 pack per day since age 64, quit in 2009.  No alcohol. No drugs. The patient's wife recently passed away. Son lives with the patient at this time.   FAMILY HISTORY: Father had pancreatic cancer. Brother had MI.   HOME MEDICATIONS:  1.  Azelastine nasal spray 2 sprays 2 times daily. 2.  Combivent inhaler 1 puff every 6 hours. 3.  Cozaar 50 mg p.o. daily. 4.  Flonase nasal spray 2 sprays in each nostril daily. 5.  Metformin 1 gram p.o. b.i.d. 6.  Norvasc 10 mg daily. 7.  Symbicort 160/4.5 two puffs b.i.d.  8.  Zoloft 50 mg p.o. daily.  REVIEW OF SYSTEMS:   CONSTITUTIONAL: No fever. No fatigue. No weakness.  EYES: No blurred vision.  ENT: No tinnitus. No epistaxis. No difficulty swallowing.  RESPIRATORY: Dry cough. Denies any wheezing. No hemoptysis.  The patient has history of COPD. CARDIOVASCULAR: Denies any chest pain. No orthopnea. No PND. No syncope.  GASTROINTESTINAL: No nausea. No vomiting. No abdominal pain.  GENITOURINARY: No dysuria.  ENDOCRINE: No polyuria or nocturia. Complains of dry mouth. SKIN: No skin rashes.  MUSCULOSKELETAL: No joint pain.  NEUROLOGIC: No numbness or weakness.  PSYCHIATRIC: No anxiety or insomnia. Does have depression recently. His wife passed away a week ago.  PHYSICAL EXAMINATION: VITAL SIGNS: Temperature 98 Fahrenheit, heart rate 90, blood pressure 114/46, sats 88% on room air. The patient's sats on 2 liters about 90. GENERAL: The patient is alert, awake and oriented, well-developed, well-nourished male, not in distress.  HEAD:  Normocephalic, atraumatic.  EYES: PERRLA. No icterus. Extraocular movements intact.  NOSE: No nasal lesions. No turbinate hypertrophy. MOUTH:  Dry. NECK: Supple. No JVD. No carotid bruit. Normal range of motion.  LUNGS:  The patient has bilateral ezxpiratory wheeze in all lung fields. The patient's breath sounds are diminished overall. Not using accessory muscles for respiration.  HEART:  S1 and S2 regular. PMI not displaced. Good femoral and pedal pulses. No peripheral edema.  ABDOMEN:  Soft, nontender and nondistended. Bowel sounds present.  MUSCULOSKELETAL: The patient able to move all extremities. No tenderness. No effusion.  SKIN: Normal. No diaphoresis. Well hydrated. NEUROLOGIC: Alert, awake and oriented. DTRs 2+ bilaterally. Sensation is intact. The patient has no dysarthria or aphasia.  PSYCHIATRIC: Mood and affect are within normal limits. Slightly depressed about the loss of his wife a week ago.   LABORATORY AND DIAGNOSTICS: chest x-ray shows bilateral lower lobe  infiltrates consistent with pneumonia  The patient also has changes of COPD.   Bl .  WBC 12.3, hemoglobin 9.7, hematocrit 29.6, platelets 279.   Electrolytes:  Sodium is 133, potassium 2.8, chloride 100, bicarb 26, BUN 24, creatinine 1.26. Glucose 351.   EKG shows normal sinus at 80 beats per minute. No ST-T changes.  ASSESSMENT AND PLAN:  24.  A 74 year old male patient with acute respiratory failure secondary to bilateral pneumonia and chronic obstructive pulmonary disease flare. The patient is going to be admitted to telemetry. He will be on Rocephin, Zithromax, oxygen, Solu-Medrol and DuoNebs.  Dr. Devona Konig is his pulmonary doctor. The patient had home oxygen multiple times, but he refused to continue because of his dry nose and tendency to bleed. We will re-evaluate at the time of discharge to see if he needs home oxygen. 2.  Chronic renal failure. Kidney function is stable.  3.  Hypokalemia. Replace the potassium.  4.  Hypertension. He is on Corgard. Continue that.  5.  Diabetes mellitus. The patient is on metformin. Continue that along with sliding scale with coverage.  6.  Depression. Continue Zoloft. 7.  Gastrointestinal prophylaxis with proton pump inhibitors.  8.  Deep venous thrombosis prophylaxis with Lovenox 40 mg sub-Q daily.  Please transfer service to Dr. Gilford Rile.  TIME SPENT ON THE PATIENT:  About 55 minutes.  ____________________________ Epifanio Lesches, MD [email or web address] D: 07/29/2012 13:15:36 ET T: 07/29/2012 13:57:25 ET JOB#: [id number]  cc: Epifanio Lesches, MD, <Dictator> Epifanio Lesches MD ELECTRONICALLY SIGNED 08/13/2012 16:29

## 2014-04-23 NOTE — Discharge Summary (Signed)
PATIENT NAME:  STPEHEN, PETITJEAN MR#:  [id number] DATE OF BIRTH:  [birth year]  DATE OF ADMISSION:  07/29/2012 DATE OF DISCHARGE:  08/04/2012  HISTORY OF PRESENT ILLNESS: Mr. Cari Caraway is a 74 year old white gentleman who presented to the Emergency Room initially because his blood sugars were running high. In the Emergency Room, he was noted to be hypoxic with an O2 sat of 88% on room air. Chest x-ray showed bilateral community-acquired pneumonia.   PAST MEDICAL HISTORY: Notable for chronic respiratory failure secondary to pulmonary fibrosis, hypertension, chronic renal failure, type 2 diabetes and colon cancer.   ALLERGIES: INCLUDED SULFA.   HOME MEDICATIONS: Included azelastine nasal spray 2 puffs each nostril twice a day, Ventolin inhaler 1 puff every 6 hours, Cozaar 50 mg daily, Flonase 2 puffs each nostril daily, metformin 1 gram b.i.d., Norvasc 10 mg daily, Zoloft 50 mg daily and Symbicort inhaler 160/4.5, 2 puffs b.i.d.   PHYSICAL EXAMINATION:   ADMISSION VITAL SIGNS: Temperature 98, pulse 90, blood pressure 114/46.  LUNGS: Examination as described by the admitting physician was notable only for the pulmonary exam where he was found to have expiratory wheezes bilaterally. He was not using the accessory muscles of inspiration. The remainder of the examination was basically unremarkable.   LABORATORY, DIAGNOSTIC AND RADIOLOGICAL DATA: The patient's admission chest x-ray showed bilateral lower lobe infiltrates consistent with pneumonia. There were also changes consistent with pulmonary fibrosis. CBC showed a hemoglobin of 9.7 with a hematocrit of 29.6. Platelet count was 279,000. White count was 12,300. Basic metabolic panel showed a sodium of 133, a potassium of 2.8, a chloride of 100 and a bicarbonate of 26. BUN was 24 with a creatinine 1.26. Blood sugar was 351 but was random. Electrocardiogram showed a normal sinus rhythm with no significant ST-T wave changes.   HOSPITAL COURSE: The  patient was admitted to the regular medical floor where he was placed on supplemental oxygen. The patient apparently had had home oxygen in the past but had started to refuse it. He had been followed by Dr. Humphrey Rolls in the past. In the hospital, he was placed on IV Rocephin and Zithromax. He was also started on IV steroids and SVNs. The patient's hospital course was one of slow but gradual recovery. His electrolyte imbalance was corrected with supplements. Despite improvement in his respiratory status, he was still requiring supplemental oxygen at the time of discharge. His blood sugar control was compromised by the fact that he had to stay on steroids to improve his breathing. He was eventually ambulated without difficulty. At the time of discharge, he actually looked better in person that he did on paper.   DISCHARGE DIAGNOSES: 1.  Acute on chronic respiratory failure secondary to chronic obstructive pulmonary disease.  2.  Community-acquired pneumonia.  3.  Hypertension.  4.  Diabetes.   DISCHARGE DISPOSITION: The patient was discharged on 2 gram sodium, diabetic diet.   DISCHARGE MEDICATIONS: Unchanged, with the exception of prednisone 20 mg 2 tablets daily, Ceftin 500 mg twice a day and Zithromax 250 mg daily. The patient was also discharged on 2 liters of oxygen.   PLAN: Activity as tolerated. He is to be followed up in the office in 1 to 2 weeks.   ____________________________ Hewitt Blade Sarina Ser, MD [email or web address] D: 08/19/2012 12:44:04 ET T: 08/19/2012 15:27:40 ET JOB#: [id number]  cc: Jenny Reichmann B. Sarina Ser, MD, <Dictator> Lottie Mussel III MD ELECTRONICALLY SIGNED 08/20/2012 8:09
